# Patient Record
Sex: Female | Born: 1950
Health system: Southern US, Community
[De-identification: ages and names within clinical notes are randomized; demographics above are authoritative.]

## PROBLEM LIST (undated history)

## (undated) HISTORY — PX: BUNIONECTOMY: SHX129

---

## 2004-09-24 ENCOUNTER — Ambulatory Visit: Payer: Self-pay | Admitting: Family Medicine

## 2004-12-17 ENCOUNTER — Ambulatory Visit: Payer: Self-pay | Admitting: Family Medicine

## 2005-01-04 ENCOUNTER — Ambulatory Visit: Payer: Self-pay | Admitting: Family Medicine

## 2005-01-09 ENCOUNTER — Ambulatory Visit (HOSPITAL_BASED_OUTPATIENT_CLINIC_OR_DEPARTMENT_OTHER): Admission: RE | Admit: 2005-01-09 | Discharge: 2005-01-09 | Payer: Self-pay | Admitting: Podiatry

## 2005-01-09 ENCOUNTER — Ambulatory Visit (HOSPITAL_COMMUNITY): Admission: RE | Admit: 2005-01-09 | Discharge: 2005-01-09 | Payer: Self-pay | Admitting: Podiatry

## 2006-06-20 ENCOUNTER — Encounter: Payer: Self-pay | Admitting: Family Medicine

## 2007-01-29 ENCOUNTER — Telehealth (INDEPENDENT_AMBULATORY_CARE_PROVIDER_SITE_OTHER): Payer: Self-pay | Admitting: *Deleted

## 2007-01-29 ENCOUNTER — Encounter: Admission: RE | Admit: 2007-01-29 | Discharge: 2007-01-29 | Payer: Self-pay | Admitting: Family Medicine

## 2007-01-29 ENCOUNTER — Other Ambulatory Visit: Admission: RE | Admit: 2007-01-29 | Discharge: 2007-01-29 | Payer: Self-pay | Admitting: Family Medicine

## 2007-01-29 ENCOUNTER — Ambulatory Visit: Payer: Self-pay | Admitting: Family Medicine

## 2007-01-29 ENCOUNTER — Encounter: Payer: Self-pay | Admitting: Family Medicine

## 2007-01-29 DIAGNOSIS — R5383 Other fatigue: Secondary | ICD-10-CM

## 2007-01-29 DIAGNOSIS — R5381 Other malaise: Secondary | ICD-10-CM | POA: Insufficient documentation

## 2007-01-29 DIAGNOSIS — N951 Menopausal and female climacteric states: Secondary | ICD-10-CM | POA: Insufficient documentation

## 2007-01-30 ENCOUNTER — Encounter: Payer: Self-pay | Admitting: Family Medicine

## 2007-02-03 LAB — CONVERTED CEMR LAB: Pap Smear: NORMAL

## 2007-02-06 ENCOUNTER — Telehealth (INDEPENDENT_AMBULATORY_CARE_PROVIDER_SITE_OTHER): Payer: Self-pay | Admitting: *Deleted

## 2007-02-09 ENCOUNTER — Telehealth: Payer: Self-pay | Admitting: Family Medicine

## 2007-02-10 ENCOUNTER — Ambulatory Visit: Payer: Self-pay | Admitting: Family Medicine

## 2007-02-10 ENCOUNTER — Encounter: Admission: RE | Admit: 2007-02-10 | Discharge: 2007-02-10 | Payer: Self-pay | Admitting: Family Medicine

## 2007-02-10 DIAGNOSIS — N390 Urinary tract infection, site not specified: Secondary | ICD-10-CM | POA: Insufficient documentation

## 2007-02-10 LAB — CONVERTED CEMR LAB
Glucose, Urine, Semiquant: NEGATIVE
Specific Gravity, Urine: 1.015

## 2007-02-13 ENCOUNTER — Telehealth: Payer: Self-pay | Admitting: Family Medicine

## 2007-02-13 ENCOUNTER — Ambulatory Visit: Payer: Self-pay | Admitting: Family Medicine

## 2007-02-13 LAB — CONVERTED CEMR LAB
Bilirubin Urine: NEGATIVE
Glucose, Urine, Semiquant: NEGATIVE
WBC Urine, dipstick: NEGATIVE
pH: 6.5

## 2007-02-14 ENCOUNTER — Encounter: Payer: Self-pay | Admitting: Family Medicine

## 2007-05-25 ENCOUNTER — Telehealth: Payer: Self-pay | Admitting: Family Medicine

## 2007-05-25 DIAGNOSIS — E785 Hyperlipidemia, unspecified: Secondary | ICD-10-CM | POA: Insufficient documentation

## 2007-05-28 ENCOUNTER — Encounter: Payer: Self-pay | Admitting: Family Medicine

## 2007-05-28 LAB — CONVERTED CEMR LAB
Cholesterol: 202 mg/dL — ABNORMAL HIGH (ref 0–200)
VLDL: 13 mg/dL (ref 0–40)

## 2007-05-29 ENCOUNTER — Telehealth: Payer: Self-pay | Admitting: Family Medicine

## 2007-05-29 ENCOUNTER — Encounter: Payer: Self-pay | Admitting: Family Medicine

## 2008-03-08 ENCOUNTER — Telehealth (INDEPENDENT_AMBULATORY_CARE_PROVIDER_SITE_OTHER): Payer: Self-pay | Admitting: *Deleted

## 2008-03-09 ENCOUNTER — Ambulatory Visit: Payer: Self-pay | Admitting: Family Medicine

## 2008-07-01 ENCOUNTER — Encounter: Admission: RE | Admit: 2008-07-01 | Discharge: 2008-07-01 | Payer: Self-pay | Admitting: Orthopedic Surgery

## 2009-01-27 ENCOUNTER — Ambulatory Visit: Payer: Self-pay | Admitting: Family Medicine

## 2009-01-27 DIAGNOSIS — R05 Cough: Secondary | ICD-10-CM

## 2009-01-27 DIAGNOSIS — R059 Cough, unspecified: Secondary | ICD-10-CM | POA: Insufficient documentation

## 2009-01-27 DIAGNOSIS — J209 Acute bronchitis, unspecified: Secondary | ICD-10-CM | POA: Insufficient documentation

## 2010-01-19 ENCOUNTER — Ambulatory Visit
Admission: RE | Admit: 2010-01-19 | Discharge: 2010-01-19 | Payer: Self-pay | Source: Home / Self Care | Admitting: Emergency Medicine

## 2010-01-19 DIAGNOSIS — T300 Burn of unspecified body region, unspecified degree: Secondary | ICD-10-CM | POA: Insufficient documentation

## 2010-01-21 ENCOUNTER — Telehealth (INDEPENDENT_AMBULATORY_CARE_PROVIDER_SITE_OTHER): Payer: Self-pay | Admitting: *Deleted

## 2010-02-11 LAB — CONVERTED CEMR LAB
ALT: 13 units/L (ref 0–35)
AST: 17 units/L (ref 0–37)
Albumin: 4.5 g/dL (ref 3.5–5.2)
BUN: 12 mg/dL (ref 6–23)
Calcium: 9.9 mg/dL (ref 8.4–10.5)
Chloride: 105 meq/L (ref 96–112)
Potassium: 4.4 meq/L (ref 3.5–5.3)
Rhuematoid fact SerPl-aCnc: <20 intl units/mL (ref 0–20)
TSH: 2.559 microintl units/mL (ref 0.350–5.50)
Total Protein: 7.1 g/dL (ref 6.0–8.3)

## 2010-02-13 NOTE — Assessment & Plan Note (Signed)
Summary: Cough-yellow, fever, runny nose-yellow x 1 mth rm 2   Vital Signs:  Patient Profile:   60 Years Old Female CC:      Cold & URI symptoms Height:     68.5 inches Weight:      159 pounds O2 Sat:      100 % O2 treatment:    Room Air Temp:     101.7 degrees F oral Pulse rate:   103 / minute Pulse rhythm:   regular Resp:     16 per minute BP sitting:   150 / 86  (right arm) Cuff size:   regular  Vitals Entered By: Areta Haber CMA (January 27, 2009 5:08 PM)                  Current Allergies: No known allergies History of Present Illness Chief Complaint: Cold & URI symptoms History of Present Illness: Subjective:  Patient complains of developing a URI about a month ago, now with persistent sinus congestion and productive cough worse at night.  One week ago she developed chills, myalgias, and increased fatigue.  No wheezing, pleuritic pain, or shortness of breath.  No GI or GU symptoms   Current Problems: ACUTE BRONCHITIS (ICD-466.0) COUGH (ICD-786.2) HYPERLIPIDEMIA (ICD-272.4) URINARY TRACT INFECTION SITE NOT SPECIFIED (ICD-599.0) ATHEROSCLEROTIC HEART DISEASE, FAMILY HX (ICD-V17.3) ROUTINE GYNECOLOGICAL EXAMINATION (ICD-V72.31) SCREENING FOR ENDOCRINE, NUTRITIONAL, METABOLIC DISORDER (ICD-V77.99) SCREENING FOR LIPOID DISORDERS (ICD-V77.91) FATIGUE (ICD-780.79) POSTMENOPAUSAL STATUS (ICD-627.2) OTHER SCREENING MAMMOGRAM (ICD-V76.12)   Current Meds CEFDINIR 300 MG CAPS (CEFDINIR) 1 by mouth q12hr BENZONATATE 200 MG CAPS (BENZONATATE) One by mouth HS as needed cough  REVIEW OF SYSTEMS Constitutional Symptoms       Complains of fever and chills.     Denies night sweats, weight loss, weight gain, and fatigue.  Eyes       Denies change in vision, eye pain, eye discharge, glasses, contact lenses, and eye surgery. Ear/Nose/Throat/Mouth       Complains of frequent runny nose and sore throat.      Denies hearing loss/aids, change in hearing, ear pain, ear  discharge, dizziness, frequent nose bleeds, sinus problems, hoarseness, and tooth pain or bleeding.      Comments: yellowish x Respiratory       Complains of productive cough.      Denies dry cough, wheezing, shortness of breath, asthma, bronchitis, and emphysema/COPD.  Cardiovascular       Denies murmurs, chest pain, and tires easily with exhertion.    Gastrointestinal       Denies stomach pain, nausea/vomiting, diarrhea, constipation, blood in bowel movements, and indigestion. Genitourniary       Denies painful urination, kidney stones, and loss of urinary control. Neurological       Denies paralysis, seizures, and fainting/blackouts. Musculoskeletal       Denies muscle pain, joint pain, joint stiffness, decreased range of motion, redness, swelling, muscle weakness, and gout.  Skin       Denies bruising, unusual mles/lumps or sores, and hair/skin or nail changes.  Psych       Denies mood changes, temper/anger issues, anxiety/stress, speech problems, depression, and sleep problems. Other Comments: Cough-yellow x . Pt has not been seen by PCP for this.   Past History:  Past Medical History: Last updated: 01/29/2007 also takes fish oil 3 capsules/ day,  foreign travel,  Hallux Rigidus R foot  Past Surgical History: Last updated: 10/22/2005 _  Family History: Last updated: 01/29/2007 1/2 brother- `heart problems`, pacemaker  Father-ETOH, HTN, AMI in 30`s, died at 39 from aneurysm, Mother- Alcoholism, pulm fibrosis, died at 3; goiter sister- thyroid dz  Social History: Last updated: 10/22/2005 Married to [place]; has 26 yo son that lives at home.  Works in [employer].  Goes to [employer], walks and eats very healthy.  Nonsmoker.  Risk Factors: Smoking Status: never (01/29/2007)   Objective:  No acute distress  Eyes:  Pupils are equal, round, and reactive to light and accomdation.  Extraocular movement is intact.  Conjunctivae are not inflamed.  Ears:  Canals normal.   Tympanic membranes normal.   Nose:  Normal septum.  Normal turbinates, mildly congested.  Maxillary sinus tenderness present.  Pharynx:  Normal; moist mucous membranes  Neck:  Supple.  No adenopathy is present.   Lungs:  Clear to auscultation.  Breath sounds are equal.  Heart:  Regular rate and rhythm without murmurs, rubs, or gallops.  Abdomen:  Nontender without masses or hepatosplenomegaly.  Bowel sounds are present.  No CVA or flank tenderness.  Extremities:  No edema.  Chest X-ray negative Assessment New Problems: ACUTE BRONCHITIS (ICD-466.0) COUGH (ICD-786.2)  Suspect bronchitis after a prolonged viral URI,  although onset of myalgias/fever last week could have been the onset of influenza    Plan New Medications/Changes: BENZONATATE 200 MG CAPS (BENZONATATE) One by mouth HS as needed cough  #12 x 0, 01/27/2009, Donna Christen MD CEFDINIR 300 MG CAPS (CEFDINIR) 1 by mouth q12hr  #20 x 0, 01/27/2009, Donna Christen MD  New Orders: T-Chest x-ray, 2 views [[id number]] New Patient Level III [99203] Planning Comments:   Begin Omnicef for 10 days, expectorant/decongestant, cough suppressant at bedtime, increased fluid intake. Follow-up with PCP if not improving one week.    Diagnoses and expected course of recovery discussed and will return if not improved as expected or if the condition worsens. Patient and/or caregiver verbalized understanding.  Prescriptions: BENZONATATE 200 MG CAPS (BENZONATATE) One by mouth HS as needed cough  #12 x 0   Entered and Authorized by:   Donna Christen MD   Signed by:   Donna Christen MD on 01/27/2009   Method used:   Print then Give to Patient   RxID:   [id number] CEFDINIR 300 MG CAPS (CEFDINIR) 1 by mouth q12hr  #20 x 0   Entered and Authorized by:   Donna Christen MD   Signed by:   Donna Christen MD on 01/27/2009   Method used:   Print then Give to Patient   RxID:   [id number]   Patient Instructions: 1)  May use Mucinex D (guaifenesin  with decongestant) twice daily for congestion. 2)  Increase fluid intake, rest. 3)   Also recommend using saline nasal spray several times daily and/or saline nasal irrigation. 4)  Followup with family doctor if not improving one week.   Appended Document: Cough-yellow, fever, runny nose-yellow x 1 mth rm 2 I CALLED Rx IN OVER PHONE TO [facility] THEIR FAX WAS NOT WORKING 7:00 PM 01/27/2009 BY TERESA JONES.
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
## 2010-02-15 NOTE — Progress Notes (Signed)
  Phone Note Outgoing Call Call back at [facility] Phone [phone]   Call placed by: Emilio Math,  January 21, 2010 2:53 PM Call placed to: Patient Summary of Call: Doing a lot better Silvadene helped a lot

## 2010-06-01 NOTE — Op Note (Signed)
Kelli Marquez, Kelli Marquez              ACCOUNT NO.:  [id number]   MEDICAL RECORD NO.:  [id number]            PATIENT TYPE:   LOCATION:                                 FACILITY:   PHYSICIAN:  Ezequiel Kayser. Ajlouny, D.P.M.DATE OF BIRTH:  10-19-1950   DATE OF PROCEDURE:  01/09/2005  DATE OF DISCHARGE:                                 OPERATIVE REPORT   PREOPERATIVE DIAGNOSES:  1.  Hallux rigidus, right first metatarsophalangeal joint.  2.  Painful exostosis, right hallux.  3.  Hammer toe second and fifth right digits.   POSTOPERATIVE DIAGNOSES:  1.  Hallux rigidus, right first metatarsophalangeal joint.  2.  Painful exostosis, right hallux.  3.  Hammer toe second and fifth right digits.   OPERATION PERFORMED:  1.  First metatarsophalangeal joint implant, joint replacement, right foot.  2.  Radical bone resection, right hallux.  3.  Hammer toe repair, second and fifth right toes.   SURGEON:  Ezequiel Kayser. Ajlouny, D.P.M.   ANESTHESIA:  MAC with local.   COMPLICATIONS:  None.   DESCRIPTION OF PROCEDURE:  The patient was brought to the operating room and  placed in the supine position at which time monitored anesthesia care was  administered.  A local block was performed with a 1:1 mixture of 0.5%  Marcaine plain and 1% lidocaine plain.  A well-padded pneumatic ankle  tourniquet was applied superior to the medial malleolus.  The patient was  prepped and draped in the usual aseptic manner.  The foot was exsanguinated  with an Esmarch bandage and the previously applied tourniquet inflated to  250 mmHg.   Attention was directed to the first ray where __________  incision was made.  The incision was deepened via sharp and blunt modality, taking care to clamp  and cauterize all bleeding vessels and ensuring retraction of all  neurovascular structures encountered.  The deep and superficial fascia was  separated medially, dorsally and laterally,  the length of the incision.  Next, the linear  capsulotomy was made and the capsule and periosteum  reflected off the head of the first metatarsal and base of the proximal  phalanx.  Two bone joint mallets were identified and excised in toto.  The  soft tissue structures were freed from the head of the first metatarsal  and  once exposed, the medial eminence was resected with a sagittal saw.  There  was a moderate amount of hypertrophic bone and the first metatarsal head was  remodeled to accommodate the implant. There were moderate erosions at the  first metatarsal head as well.  Next, attention was directed to the base of  the proximal phalanx where the distal third of the proximal phalanx was  resected measuring approximately 7 mm which is enough to accommodate the  implant as well as decompress the joint. Care was taken not to __________  flexor apparatus.  Next, using trial sizers, a size small implant was  identified as the appropriate size and a size small cobalt chromium Biopro  hemi-implant was then implanted to the base of the proximal phalanx after  drilling to  accommodate the stem of the implant.  All bone remodeling was  performed utilizing power and hand rasps.  First metatarsophalangeal joint  range of motion was excellent with no restrictions.  The surgical wound was  irrigated with copious amounts of sterile saline and antibiotic solution.  Deep closure was accomplished using 3-0 Vicryl to the periosteum and  capsule.  Subcutaneous closure was accomplished using 4-0 Vicryl and skin  closure was accomplished.  Next attention was directed to the medial aspect  of the right hallux where linear incision was made over the palpable  prominence.  The incision was deepened using sharp and blunt modalities  taking care to clamp, cauterize all bleeding vessels and ensuring retraction  of all neurovascular structures encountered.  Dissection was carried to the  level of the bone and the periosteum was reflected off the prominent  bone  which was then resected utilizing sagittal saw and all rough edges smoothed  with a power and hand rasp.  Placement of the implant and reduction of the  hallux was identified to be appropriate under the C-arm.  Next, attention  was directed to the second digit where a curvilinear incision was made  extending over the metatarsophalangeal joint and linearly over the proximal  interphalangeal joint.  Incision was deepened via sharp and blunt  modalities, taking to clamp and cauterize all bleeding vessels and ensuring  retraction of all neurovascular structures encountered.  Deep and  superficial fascia were separated medially and laterally the length of the  incision.  Attention was directed to the level of the proximal  interphalangeal joint where soft tissue structures were freed off the head  of the proximal phalanx and the extensor apparatus released.  Once exposed,  the head of the proximal phalanx was resected with sagittal saw and  articular cartilage was resected off the base of the middle phalanx.  The  bone was still found not to be reducible and therefore attention was  directed to the metatarsophalangeal joint where dissection continued and the  extensor tendon was released and the dorsal capsulotomy was performed.  The  bone was still not reducible and therefore, a medial and lateral capsulotomy  was performed and plantar adhesions were freed with a metatarsal elevator.  The deformity was found to be reducible and the wounds irrigated with  sterile saline.  The digit was then stabilized with a 0.045 K-wire and  placement was confirmed with intraoperative radiograph.  The wound was  irrigated again and deep closure was accomplished using 4-0 Vicryl and skin  closure accomplished using 4-0 and 5-0 nylon.  Next, attention was directed  to the fifth digit where incision was made overlying the proximal phalangeal joint.  Incision was deepened via sharp and blunt modality, taking  care to  clamp and cauterize all bleeding vessels and ensuring retraction of all  neurovascular structures encountered.  The deep and superficial fascia were  separated at the level of the proximal phalangeal joint where the extensor  apparatus was released.  All soft tissue structures were freed from the head  of the proximal phalanx.  Bone was exposed.  The head of the proximal  phalanx was resected utilizing the sagittal saw.  The wound was irrigated  with copious amounts of sterile saline and the tendon reapproximated  utilizing 4-0 Vicryl.  Skin closure accomplished using 5-0 nylon.  Postoperatively, dexamethasone phosphate was infiltrated around the surgical  site as well as 0.5% Marcaine plain.  Foot was dressed with Xeroform, 4  x  4s, Kling and Coban.  The tourniquet was deflated and neurovascular status  returned to all digits.  The patient was sent to recovery room with vital  signs stable and capillary refill time at presurgical levels.  Both written  and oral postoperative instructions were given to the patient.  Postoperative shoe dispensed. All questions answered.  No guarantees given.           ______________________________  Ezequiel Kayser. Harriet Pho, D.P.M.     MJA/MEDQ  D:  01/22/2005  T:  01/22/2005  Job:  [id number]

## 2012-02-19 ENCOUNTER — Ambulatory Visit (INDEPENDENT_AMBULATORY_CARE_PROVIDER_SITE_OTHER): Payer: 59 | Admitting: Physician Assistant

## 2012-02-19 ENCOUNTER — Telehealth: Payer: Self-pay | Admitting: *Deleted

## 2012-02-19 ENCOUNTER — Encounter: Payer: Self-pay | Admitting: Physician Assistant

## 2012-02-19 VITALS — BP 130/71 | HR 80 | Wt 143.0 lb

## 2012-02-19 DIAGNOSIS — Z1239 Encounter for other screening for malignant neoplasm of breast: Secondary | ICD-10-CM

## 2012-02-19 DIAGNOSIS — Z780 Asymptomatic menopausal state: Secondary | ICD-10-CM

## 2012-02-19 DIAGNOSIS — Z0000 Encounter for general adult medical examination without abnormal findings: Secondary | ICD-10-CM

## 2012-02-19 DIAGNOSIS — Z23 Encounter for immunization: Secondary | ICD-10-CM

## 2012-02-19 DIAGNOSIS — Z131 Encounter for screening for diabetes mellitus: Secondary | ICD-10-CM

## 2012-02-19 DIAGNOSIS — Z13820 Encounter for screening for osteoporosis: Secondary | ICD-10-CM

## 2012-02-19 DIAGNOSIS — Z13220 Encounter for screening for lipoid disorders: Secondary | ICD-10-CM

## 2012-02-19 DIAGNOSIS — E785 Hyperlipidemia, unspecified: Secondary | ICD-10-CM

## 2012-02-19 LAB — COMPLETE METABOLIC PANEL WITH GFR
AST: 16 U/L (ref 0–37)
Albumin: 4.4 g/dL (ref 3.5–5.2)
BUN: 16 mg/dL (ref 6–23)
Calcium: 9.4 mg/dL (ref 8.4–10.5)
Chloride: 106 mEq/L (ref 96–112)
Creat: 0.6 mg/dL (ref 0.50–1.10)
GFR, Est Non African American: >89 mL/min
Glucose, Bld: 92 mg/dL (ref 70–99)

## 2012-02-19 LAB — TSH: TSH: 2.772 u[IU]/mL (ref 0.350–4.500)

## 2012-02-19 LAB — LIPID PANEL
Cholesterol: 189 mg/dL (ref 0–200)
Total CHOL/HDL Ratio: 3.2 Ratio
Triglycerides: 70 mg/dL (ref <150)
VLDL: 14 mg/dL (ref 0–40)

## 2012-02-19 NOTE — Progress Notes (Signed)
  Subjective:     Kelli Marquez is a 62 y.o. female and is here for a comprehensive physical exam. The patient reports no problems.  Has a strong family history of heart disease, MI's and strokes. Pt aware we need to watch elevated LDL/TG. Currently on no medications.  History   Social History  . Marital Status: Married    Spouse Name: N/A    Number of Children: N/A  . Years of Education: N/A   Occupational History  . Not on file.   Social History Main Topics  . Smoking status: Never Smoker   . Smokeless tobacco: Never Used  . Alcohol Use: Not on file  . Drug Use: No  . Sexually Active: Not on file   Other Topics Concern  . Not on file   Social History Narrative  . No narrative on file   Health Maintenance  Topic Date Due  . Tetanus/tdap  12/28/1969  . Colonoscopy  12/28/2000  . Mammogram  02/09/2009  . Zostavax  12/29/2010  . Pap Smear  02/18/2013  . Influenza Vaccine  09/15/2011    The following portions of the patient's history were reviewed and updated as appropriate: allergies, current medications, past family history, past medical history, past social history, past surgical history and problem list.  Review of Systems A comprehensive review of systems was negative.   Objective:    BP 130/71  Pulse 80  Wt 143 lb (64.864 kg) General appearance: alert, cooperative and appears stated age Head: Normocephalic, without obvious abnormality, atraumatic Eyes: conjunctivae/corneas clear. PERRL, EOM's intact. Fundi benign. Ears: normal TM's and external ear canals both ears Nose: Nares normal. Septum midline. Mucosa normal. No drainage or sinus tenderness. Throat: lips, mucosa, and tongue normal; teeth and gums normal Neck: no adenopathy, no carotid bruit, no JVD, supple, symmetrical, trachea midline and thyroid not enlarged, symmetric, no tenderness/mass/nodules Back: symmetric, no curvature. ROM normal. No CVA tenderness. Lungs: clear to auscultation  bilaterally Breasts: normal appearance, no masses or tenderness Heart: regular rate and rhythm, S1, S2 normal, no murmur, click, rub or gallop Abdomen: soft, non-tender; bowel sounds normal; no masses,  no organomegaly Extremities: extremities normal, atraumatic, no cyanosis or edema Pulses: 2+ and symmetric Skin: Skin color, texture, turgor normal. No rashes or lesions Lymph nodes: Cervical, supraclavicular, and axillary nodes normal. Neurologic: Grossly normal    Assessment:    Healthy female exam.      Plan:    CPE/Hyperlipidemia- Gave pt lab slip to have Lipid drawn along with vitamins and thyroid. Will call pt with results. Declines pap smear today. Never had abnormal pap. Same partner. Will get another in next 5 years. Discussed with patient importance of calcium for bone health. Discussed 4 servings or 1200 mg daily. Encouraged continual exercise. Pt is doing a lot to try to stay off cholesterol meds but aware of strong family history.   Shingles vaccine given. Pt feels like she has gotten Tdap in last 10 years would like to wait.  Will refer for mammogram.  Referred for Bone Density. Declines Colonoscopy pt was given stool cards to return to clinic.   See After Visit Summary for Counseling Recommendations

## 2012-02-19 NOTE — Telephone Encounter (Signed)
Yes see if can add.

## 2012-02-19 NOTE — Patient Instructions (Addendum)
Gave shingles shot today.   Will call with labs.   STay active.

## 2012-02-19 NOTE — Telephone Encounter (Signed)
Pt forgot to ask you to test her thyroid today when you ordered her labs.  Would you like me to call & see if it can be added to what was already drawn? Please advise

## 2012-02-19 NOTE — Telephone Encounter (Signed)
TSH added

## 2012-02-20 LAB — VITAMIN D 25 HYDROXY (VIT D DEFICIENCY, FRACTURES): Vit D, 25-Hydroxy: 35 ng/mL (ref 30–89)

## 2012-02-25 ENCOUNTER — Other Ambulatory Visit: Payer: 59

## 2012-02-25 ENCOUNTER — Telehealth: Payer: Self-pay | Admitting: *Deleted

## 2012-02-25 NOTE — Progress Notes (Signed)
  Subjective:    Patient ID: Kelli Marquez, female    DOB: 05-14-1950, 62 y.o.   MRN: [id number]  HPI    Review of Systems     Objective:   Physical Exam        Assessment & Plan:  Received pt home hemocult cards on 02-25-12.  Hemocult x3 negative. AB

## 2012-02-25 NOTE — Addendum Note (Signed)
Addended by: Donne Anon L on: 02/25/2012 01:30 PM   Modules accepted: Orders

## 2012-11-19 ENCOUNTER — Other Ambulatory Visit: Payer: Self-pay

## 2014-01-11 ENCOUNTER — Encounter: Payer: Self-pay | Admitting: Physician Assistant

## 2014-01-11 ENCOUNTER — Ambulatory Visit (INDEPENDENT_AMBULATORY_CARE_PROVIDER_SITE_OTHER): Payer: 59 | Admitting: Physician Assistant

## 2014-01-11 ENCOUNTER — Other Ambulatory Visit: Payer: Self-pay | Admitting: Physician Assistant

## 2014-01-11 VITALS — BP 156/85 | HR 74 | Ht 68.5 in | Wt 165.0 lb

## 2014-01-11 DIAGNOSIS — R002 Palpitations: Secondary | ICD-10-CM

## 2014-01-11 DIAGNOSIS — R6889 Other general symptoms and signs: Secondary | ICD-10-CM

## 2014-01-11 LAB — CBC WITH DIFFERENTIAL/PLATELET
BASOS ABS: 0.1 10*3/uL (ref 0.0–0.1)
Basophils Relative: 1 % (ref 0–1)
Eosinophils Absolute: 0.2 10*3/uL (ref 0.0–0.7)
Eosinophils Relative: 4 % (ref 0–5)
HCT: 43.2 % (ref 36.0–46.0)
Hemoglobin: 14.1 g/dL (ref 12.0–15.0)
LYMPHS ABS: 1.3 10*3/uL (ref 0.7–4.0)
LYMPHS PCT: 24 % (ref 12–46)
MCH: 29.1 pg (ref 26.0–34.0)
MCHC: 32.6 g/dL (ref 30.0–36.0)
MCV: 89.1 fL (ref 78.0–100.0)
MONO ABS: 0.4 10*3/uL (ref 0.1–1.0)
MONOS PCT: 8 % (ref 3–12)
MPV: 10.5 fL (ref 8.6–12.4)
NEUTROS ABS: 3.5 10*3/uL (ref 1.7–7.7)
Neutrophils Relative %: 63 % (ref 43–77)
PLATELETS: 294 10*3/uL (ref 150–400)
RBC: 4.85 MIL/uL (ref 3.87–5.11)
RDW: 13.4 % (ref 11.5–15.5)
WBC: 5.6 10*3/uL (ref 4.0–10.5)

## 2014-01-11 LAB — COMPLETE METABOLIC PANEL WITH GFR
ALT: 15 U/L (ref 0–35)
AST: 17 U/L (ref 0–37)
Albumin: 4.1 g/dL (ref 3.5–5.2)
Alkaline Phosphatase: 65 U/L (ref 39–117)
BUN: 11 mg/dL (ref 6–23)
CALCIUM: 9.7 mg/dL (ref 8.4–10.5)
CO2: 27 meq/L (ref 19–32)
CREATININE: 0.61 mg/dL (ref 0.50–1.10)
Chloride: 104 mEq/L (ref 96–112)
GFR, Est Non African American: >89 mL/min
GLUCOSE: 103 mg/dL — AB (ref 70–99)
Potassium: 4.4 mEq/L (ref 3.5–5.3)
Sodium: 139 mEq/L (ref 135–145)
Total Bilirubin: 0.4 mg/dL (ref 0.2–1.2)
Total Protein: 6.6 g/dL (ref 6.0–8.3)

## 2014-01-11 LAB — LIPID PANEL
Cholesterol: 222 mg/dL — ABNORMAL HIGH (ref 0–200)
HDL: 55 mg/dL (ref >39)
LDL Cholesterol: 143 mg/dL — ABNORMAL HIGH (ref 0–99)
Total CHOL/HDL Ratio: 4 Ratio
Triglycerides: 119 mg/dL (ref <150)
VLDL: 24 mg/dL (ref 0–40)

## 2014-01-11 NOTE — Patient Instructions (Signed)
Premature Ventricular Contraction Premature ventricular contraction (PVC) is an irregularity of the heart rhythm involving extra or skipped heartbeats. In some cases, they may occur without obvious cause or heart disease. Other times, they can be caused by an electrolyte change in the blood. These need to be corrected. They can also be seen when there is not enough oxygen going to the heart. A common cause of this is plaque or cholesterol buildup. This buildup decreases the blood supply to the heart. In addition, extra beats may be caused or aggravated by:  Excessive smoking.  Alcohol consumption.  Caffeine.  Certain medications  Some street drugs. SYMPTOMS   The sensation of feeling your heart skipping a beat (palpitations).  In many cases, the person may have no symptoms. SIGNS AND TESTS   A physical examination may show an occasional irregularity, but if the PVC beats do not happen often, they may not be found on physical exam.  Blood pressure is usually normal.  Other tests that may find extra beats of the heart are:  An EKG (electrocardiogram)  A Holter monitor which can monitor your heart over longer periods of time  An Angiogram (study of the heart arteries). TREATMENT  Usually extra heartbeats do not need treatment. The condition is treated only if symptoms are severe or if extra beats are very frequent or are causing problems. An underlying cause, if discovered, may also require treatment.  Treatment may also be needed if there may be a risk for other more serious cardiac arrhythmias.  PREVENTION   Moderation in caffeine, alcohol, and tobacco use may reduce the risk of ectopic heartbeats in some people.  Exercise often helps people who lead a sedentary (inactive) lifestyle. PROGNOSIS  PVC heartbeats are generally harmless and do not need treatment.  RISKS AND COMPLICATIONS   Ventricular tachycardia (occasionally).  There usually are no complications.  Other  arrhythmias (occasionally). SEEK IMMEDIATE MEDICAL CARE IF:   You feel palpitations that are frequent or continual.  You develop chest pain or other problems such as shortness of breath, sweating, or nausea and vomiting.  You become light-headed or faint (pass out).  You get worse or do not improve with treatment. Document Released: 08/18/2003 Document Revised: 03/25/2011 Document Reviewed: 02/27/2007 [employer] Patient Information 2015 [employer]. This information is not intended to replace advice given to you by your health care provider. Make sure you discuss any questions you have with your health care provider.  

## 2014-01-11 NOTE — Progress Notes (Signed)
   Subjective:    Patient ID: Kelli Marquez, female    DOB: July 01, 1950, 63 y.o.   MRN: [id number]  HPI  Pt presents to the clinic with palpitations off and on for last 6 months. Family hx of thyroid problems. She is concerned because she has also been more cold than usual. She also has some off and on muscle aches. Palpitations are once a week maybe twice. No known triggers. No SOB, chest pains associated. Not associated with exertion.     Review of Systems  All other systems reviewed and are negative.      Objective:   Physical Exam  Constitutional: She is oriented to person, place, and time. She appears well-developed and well-nourished.  HENT:  Head: Normocephalic and atraumatic.  Cardiovascular: Normal rate, regular rhythm and normal heart sounds.   Pulmonary/Chest: Effort normal and breath sounds normal. She has no wheezes.  Neurological: She is alert and oriented to person, place, and time.  Skin: Skin is dry.  Psychiatric: She has a normal mood and affect. Her behavior is normal.          Assessment & Plan:  Heart palpitations/cold intolerance-  EKG- NSR at 71, no ST elevation or depression, no arrhymias, slighlty peaked t waves v2-v4 will correlate with labs  Symptoms sound consisent with PVC's. HO given. Discussed triggers.  Will check for anemia, thyroid issues, electrolyte issues.  If not improving will consider heart monitor. Pt aware medication to treat as well if would like to see if decreased episodes.  Discussed if had an episode that did not pass to come if for EKG torule out a.fib.    Hyperlipidemia- fasting and not checked in over a years. Pt given lab slip.

## 2014-01-12 LAB — FERRITIN: FERRITIN: 38 ng/mL (ref 10–291)

## 2014-01-12 LAB — VITAMIN D 25 HYDROXY (VIT D DEFICIENCY, FRACTURES): Vit D, 25-Hydroxy: 33 ng/mL (ref 30–100)

## 2014-01-12 LAB — TSH: TSH: 2.897 u[IU]/mL (ref 0.350–4.500)

## 2014-01-12 LAB — VITAMIN B12: Vitamin B-12: 593 pg/mL (ref 211–911)

## 2014-01-12 LAB — T4, FREE: FREE T4: 1.2 ng/dL (ref 0.80–1.80)

## 2014-01-13 LAB — HEMOGLOBIN A1C
Hgb A1c MFr Bld: 6.3 % — ABNORMAL HIGH (ref <5.7)
Mean Plasma Glucose: 134 mg/dL — ABNORMAL HIGH (ref <117)

## 2014-01-16 ENCOUNTER — Encounter: Payer: Self-pay | Admitting: Physician Assistant

## 2014-01-16 DIAGNOSIS — R7303 Prediabetes: Secondary | ICD-10-CM | POA: Insufficient documentation

## 2014-01-17 ENCOUNTER — Encounter: Payer: Self-pay | Admitting: Physician Assistant

## 2014-01-18 NOTE — Addendum Note (Signed)
Addended by: Chalmers CaterUTTLE, Amberrose Friebel H on: 01/18/2014 08:39 AM   Modules accepted: Orders

## 2014-02-16 ENCOUNTER — Encounter: Payer: Self-pay | Admitting: Physician Assistant

## 2014-02-18 ENCOUNTER — Telehealth: Payer: Self-pay

## 2014-02-18 NOTE — Addendum Note (Signed)
Addended by: Chalmers CaterUTTLE, Gardenia Witter H on: 02/18/2014 09:02 AM   Modules accepted: Orders

## 2014-02-18 NOTE — Telephone Encounter (Signed)
Great!

## 2014-02-18 NOTE — Telephone Encounter (Signed)
Kelli Marquez, Patient sent a message through MyChart. After reviewing the message and the last office note I ordered an event monitor.

## 2014-02-28 ENCOUNTER — Ambulatory Visit (INDEPENDENT_AMBULATORY_CARE_PROVIDER_SITE_OTHER): Payer: 59 | Admitting: Physician Assistant

## 2014-02-28 ENCOUNTER — Encounter: Payer: Self-pay | Admitting: Physician Assistant

## 2014-02-28 VITALS — BP 124/71 | HR 96 | Ht 68.5 in | Wt 150.0 lb

## 2014-02-28 DIAGNOSIS — R002 Palpitations: Secondary | ICD-10-CM

## 2014-02-28 DIAGNOSIS — E785 Hyperlipidemia, unspecified: Secondary | ICD-10-CM

## 2014-02-28 DIAGNOSIS — I493 Ventricular premature depolarization: Secondary | ICD-10-CM

## 2014-02-28 NOTE — Progress Notes (Signed)
   Subjective:    Patient ID: Kelli Marquez, female    DOB: [birth year], 64 y.o.   MRN: [id number]  HPI Pt presents to the clinic to follow up on heart palpitations. Was seen in December with normal EKG. We had scheduled a cardiac heart monitor but never got released in epic and was never scheduled. She doesn't feel like worse but the same. Still concerned because of all the heart issues in her family. Denies any CP, tightness, headaches, jaw pain or vision changes. LDL was elevated but under 160 and pt declined medications at this time working on diet and exercise. Labs to check thyroid, anemia, cmp were all normal.    Review of Systems  All other systems reviewed and are negative.      Objective:   Physical Exam  Constitutional: She is oriented to person, place, and time. She appears well-developed and well-nourished.  HENT:  Head: Normocephalic and atraumatic.  Cardiovascular: Normal rate and normal heart sounds.   I was able to auscultate some PVC's today.   Pulmonary/Chest: Effort normal and breath sounds normal.  Neurological: She is alert and oriented to person, place, and time.  Psychiatric: She has a normal mood and affect. Her behavior is normal.          Assessment & Plan:  Heart palpitations- release cardiac monitor to be ordered. Gave HO on PVC's. Pt does not drink alcohol or caffiene or smoke.  Discussed how beta blockers could decrease palpitations. She is not willing to start at this time due to potential side effects.   Hyperlipidemia- still working on diet and exercise. Will check in 6 months. Concerned with family hx and risk of blockages and PVC's. Will order stress test as well.

## 2014-02-28 NOTE — Patient Instructions (Addendum)
Atrial Fibrillation Atrial fibrillation is a type of irregular heart rhythm (arrhythmia). During atrial fibrillation, the upper chambers of the heart (atria) quiver continuously in a chaotic pattern. This causes an irregular and often rapid heart rate.  Atrial fibrillation is the result of the heart becoming overloaded with disorganized signals that tell it to beat. These signals are normally released one at a time by a part of the right atrium called the sinoatrial node. They then travel from the atria to the lower chambers of the heart (ventricles), causing the atria and ventricles to contract and pump blood as they pass. In atrial fibrillation, parts of the atria outside of the sinoatrial node also release these signals. This results in two problems. First, the atria receive so many signals that they do not have time to fully contract. Second, the ventricles, which can only receive one signal at a time, beat irregularly and out of rhythm with the atria.  There are three types of atrial fibrillation:   Paroxysmal. Paroxysmal atrial fibrillation starts suddenly and stops on its own within a week.  Persistent. Persistent atrial fibrillation lasts for more than a week. It may stop on its own or with treatment.  Permanent. Permanent atrial fibrillation does not go away. Episodes of atrial fibrillation may lead to permanent atrial fibrillation. Atrial fibrillation can prevent your heart from pumping blood normally. It increases your risk of stroke and can lead to heart failure.  CAUSES   Heart conditions, including a heart attack, heart failure, coronary artery disease, and heart valve conditions.   Inflammation of the sac that surrounds the heart (pericarditis).  Blockage of an artery in the lungs (pulmonary embolism).  Pneumonia or other infections.  Chronic lung disease.  Thyroid problems, especially if the thyroid is overactive (hyperthyroidism).  Caffeine, excessive alcohol use, and use  of some illegal drugs.   Use of some medicines, including certain decongestants and diet pills.  Heart surgery.   Birth defects.  Sometimes, no cause can be found. When this happens, the atrial fibrillation is called lone atrial fibrillation. The risk of complications from atrial fibrillation increases if you have lone atrial fibrillation and you are age 23 years or older. RISK FACTORS  Heart failure.  Coronary artery disease.  Diabetes mellitus.   High blood pressure (hypertension).   Obesity.   Other arrhythmias.   Increased age. SIGNS AND SYMPTOMS   A feeling that your heart is beating rapidly or irregularly.   A feeling of discomfort or pain in your chest.   Shortness of breath.   Sudden light-headedness or weakness.   Getting tired easily when exercising.   Urinating more often than normal (mainly when atrial fibrillation first begins).  In paroxysmal atrial fibrillation, symptoms may start and suddenly stop. DIAGNOSIS  Your health care provider may be able to detect atrial fibrillation when taking your pulse. Your health care provider may have you take a test called an ambulatory electrocardiogram (ECG). An ECG records your heartbeat patterns over a 24-hour period. You may also have other tests, such as:  Transthoracic echocardiogram (TTE). During echocardiography, sound waves are used to evaluate how blood flows through your heart.  Transesophageal echocardiogram (TEE).  Stress test. There is more than one type of stress test. If a stress test is needed, ask your health care provider about which type is best for you.  Chest X-ray exam.  Blood tests.  Computed tomography (CT). TREATMENT  Treatment may include:  Treating any underlying conditions. For example, if you  have an overactive thyroid, treating the condition may correct atrial fibrillation.  Taking medicine. Medicines may be given to control a rapid heart rate or to prevent blood  clots, heart failure, or a stroke.  Having a procedure to correct the rhythm of the heart:  Electrical cardioversion. During electrical cardioversion, a controlled, low-energy shock is delivered to the heart through your skin. If you have chest pain, very low blood pressure, or sudden heart failure, this procedure may need to be done as an emergency.  Catheter ablation. During this procedure, heart tissues that send the signals that cause atrial fibrillation are destroyed.  Surgical ablation. During this surgery, thin lines of heart tissue that carry the abnormal signals are destroyed. This procedure can either be an open-heart surgery or a minimally invasive surgery. With the minimally invasive surgery, small cuts are made to access the heart instead of a large opening.  Pulmonary venous isolation. During this surgery, tissue around the veins that carry blood from the lungs (pulmonary veins) is destroyed. This tissue is thought to carry the abnormal signals. HOME CARE INSTRUCTIONS   Take medicines only as directed by your health care provider. Some medicines can make atrial fibrillation worse or recur.  If blood thinners were prescribed by your health care provider, take them exactly as directed. Too much blood-thinning medicine can cause bleeding. If you take too little, you will not have the needed protection against stroke and other problems.  Perform blood tests at home if directed by your health care provider. Perform blood tests exactly as directed.  Quit smoking if you smoke.  Do not drink alcohol.  Do not drink caffeinated beverages such as coffee, soda, and some teas. You may drink decaffeinated coffee, soda, or tea.   Maintain a healthy weight.Do not use diet pills unless your health care provider approves. They may make heart problems worse.   Follow diet instructions as directed by your health care provider.  Exercise regularly as directed by your health care  provider.  Keep all follow-up visits as directed by your health care provider. This is important. PREVENTION  The following substances can cause atrial fibrillation to recur:   Caffeinated beverages.  Alcohol.  Certain medicines, especially those used for breathing problems.  Certain herbs and herbal medicines, such as those containing ephedra or ginseng.  Illegal drugs, such as cocaine and amphetamines. Sometimes medicines are given to prevent atrial fibrillation from recurring. Proper treatment of any underlying condition is also important in helping prevent recurrence.  SEEK MEDICAL CARE IF:  You notice a change in the rate, rhythm, or strength of your heartbeat.  You suddenly begin urinating more frequently.  You tire more easily when exerting yourself or exercising. SEEK IMMEDIATE MEDICAL CARE IF:   You have chest pain, abdominal pain, sweating, or weakness.  You feel nauseous.  You have shortness of breath.  You suddenly have swollen feet and ankles.  You feel dizzy.  Your face or limbs feel numb or weak.  You have a change in your vision or speech. MAKE SURE YOU:   Understand these instructions.  Will watch your condition.  Will get help right away if you are not doing well or get worse. Document Released: 12/31/2004 Document Revised: 05/17/2013 Document Reviewed: 02/11/2012 [facility] Patient Information 2015 [place], [place]. This information is not intended to replace advice given to you by your health care provider. Make sure you discuss any questions you have with your health care provider.   Premature Ventricular Contraction Premature  ventricular contraction (PVC) is an irregularity of the heart rhythm involving extra or skipped heartbeats. In some cases, they may occur without obvious cause or heart disease. Other times, they can be caused by an electrolyte change in the blood. These need to be corrected. They can also be seen when there is not enough  oxygen going to the heart. A common cause of this is plaque or cholesterol buildup. This buildup decreases the blood supply to the heart. In addition, extra beats may be caused or aggravated by:  Excessive smoking.  Alcohol consumption.  Caffeine.  Certain medications  Some street drugs. SYMPTOMS   The sensation of feeling your heart skipping a beat (palpitations).  In many cases, the person may have no symptoms. SIGNS AND TESTS   A physical examination may show an occasional irregularity, but if the PVC beats do not happen often, they may not be found on physical exam.  Blood pressure is usually normal.  Other tests that may find extra beats of the heart are:  An EKG (electrocardiogram)  A Holter monitor which can monitor your heart over longer periods of time  An Angiogram (study of the heart arteries). TREATMENT  Usually extra heartbeats do not need treatment. The condition is treated only if symptoms are severe or if extra beats are very frequent or are causing problems. An underlying cause, if discovered, may also require treatment.  Treatment may also be needed if there may be a risk for other more serious cardiac arrhythmias.  PREVENTION   Moderation in caffeine, alcohol, and tobacco use may reduce the risk of ectopic heartbeats in some people.  Exercise often helps people who lead a sedentary (inactive) lifestyle. PROGNOSIS  PVC heartbeats are generally harmless and do not need treatment.  RISKS AND COMPLICATIONS   Ventricular tachycardia (occasionally).  There usually are no complications.  Other arrhythmias (occasionally). SEEK IMMEDIATE MEDICAL CARE IF:   You feel palpitations that are frequent or continual.  You develop chest pain or other problems such as shortness of breath, sweating, or nausea and vomiting.  You become light-headed or faint (pass out).  You get worse or do not improve with treatment. Document Released: 08/18/2003 Document Revised:  03/25/2011 Document Reviewed: 02/27/2007 [facility] Patient Information 2015 [place]. This information is not intended to replace advice given to you by your health care provider. Make sure you discuss any questions you have with your health care provider.

## 2014-03-01 DIAGNOSIS — E785 Hyperlipidemia, unspecified: Secondary | ICD-10-CM | POA: Insufficient documentation

## 2014-03-01 DIAGNOSIS — I493 Ventricular premature depolarization: Secondary | ICD-10-CM | POA: Insufficient documentation

## 2014-06-09 ENCOUNTER — Ambulatory Visit: Payer: 59 | Admitting: Family Medicine

## 2015-05-31 ENCOUNTER — Telehealth: Payer: Self-pay | Admitting: Physician Assistant

## 2015-05-31 ENCOUNTER — Ambulatory Visit (INDEPENDENT_AMBULATORY_CARE_PROVIDER_SITE_OTHER): Payer: 59 | Admitting: Physician Assistant

## 2015-05-31 ENCOUNTER — Encounter: Payer: Self-pay | Admitting: Physician Assistant

## 2015-05-31 VITALS — BP 128/73 | HR 80 | Ht 68.5 in | Wt 143.0 lb

## 2015-05-31 DIAGNOSIS — Z8249 Family history of ischemic heart disease and other diseases of the circulatory system: Secondary | ICD-10-CM | POA: Diagnosis not present

## 2015-05-31 DIAGNOSIS — R1013 Epigastric pain: Secondary | ICD-10-CM | POA: Diagnosis not present

## 2015-05-31 MED ORDER — OMEPRAZOLE 40 MG PO CPDR: 40.0000 mg | DELAYED_RELEASE_CAPSULE | Freq: Every day | ORAL | Status: DC

## 2015-05-31 NOTE — Progress Notes (Signed)
   Subjective:    Patient ID: Kelli Marquez, female    DOB: 07/30/1950, 65 y.o.   MRN: [id number]  HPI Patient is a 65 year old female who presents to the clinic with some epigastric pressure for the last few months. She denies pain or acid reflux symptoms. She denies any indigestion. She denies any changes in her stools with color or consistency. She describes the symptoms as a pressure in the upper abdomen that comes and goes and often feels "bubbly". She is concerned because her father died from a abdominal aortic aneurysm. She has never had a history of hypertension. Her cholesterol was controlled at last check. She did have a abdominal ultrasound in 2009 that was negative for any aneurysm. She is not aware of anything that causes these symptoms.   Review of Systems  All other systems reviewed and are negative.      Objective:   Physical Exam  Constitutional: She is oriented to person, place, and time. She appears well-developed and well-nourished.  HENT:  Head: Normocephalic and atraumatic.  Cardiovascular: Normal rate, regular rhythm and normal heart sounds.   Pulmonary/Chest: Effort normal and breath sounds normal.  Abdominal: Soft. Bowel sounds are normal. She exhibits no distension and no mass. There is no tenderness. There is no rebound and no guarding.  Neurological: She is alert and oriented to person, place, and time.  Skin: Skin is dry.  Psychiatric: She has a normal mood and affect. Her behavior is normal.          Assessment & Plan:  Epigastric pressure-I feel like symptoms are most consistent with hiatal hernia. I did give patient a handout on hiatal hernia. I offered her a proton pump inhibitor to take for the next 2 months. I'm aware she is not having acid reflux symptoms but this could also help with the symptoms she is having. Since she does have a family history of abdominal aortic aneurysm I think it is reasonable to order an ultrasound of the abdomen to look  for this. She would like to hold off on this into her complete physical in the summer. Encouraged her to follow-up sooner if the symptoms change.  Patient needs a complete physical to discuss needed health maintenance items. We had ordered the colon guard in the past and she reports sending a sample but never getting the results. I do not see the results in the chart. I will have amber call the company and see if the results are on file there.

## 2015-05-31 NOTE — Patient Instructions (Signed)
Hiatal Hernia  A hiatal hernia occurs when part of your stomach slides above the muscle that separates your abdomen from your chest (diaphragm). You can be born with a hiatal hernia (congenital), or it may develop over time. In almost all cases of hiatal hernia, only the top part of the stomach pushes through.   Many people have a hiatal hernia with no symptoms. The larger the hernia, the more likely that you will have symptoms. In some cases, a hiatal hernia allows stomach acid to flow back into the tube that carries food from your mouth to your stomach (esophagus). This may cause heartburn symptoms. Severe heartburn symptoms may mean you have developed a condition called gastroesophageal reflux disease (GERD).   CAUSES   Hiatal hernias are caused by a weakness in the opening (hiatus) where your esophagus passes through your diaphragm to attach to the upper part of your stomach. You may be born with a weakness in your hiatus, or a weakness can develop.  RISK FACTORS  Older age is a major risk factor for a hiatal hernia. Anything that increases pressure on your diaphragm can also increase your risk of a hiatal hernia. This includes:  · Pregnancy.  · Excess weight.  · Frequent constipation.  SIGNS AND SYMPTOMS   People with a hiatal hernia often have no symptoms. If symptoms develop, they are almost always caused by GERD. They may include:  · Heartburn.  · Belching.  · Indigestion.  · Trouble swallowing.  · Coughing or wheezing.   · Sore throat.  · Hoarseness.  · Chest pain.  DIAGNOSIS   A hiatal hernia is sometimes found during an exam for another problem. Your health care provider may suspect a hiatal hernia if you have symptoms of GERD. Tests may be done to diagnose GERD. These may include:  · X-rays of your stomach or chest.  · An upper gastrointestinal (GI) series. This is an X-ray exam of your GI tract involving the use of a chalky liquid that you swallow. The liquid shows up clearly on the X-ray.  · Endoscopy.  This is a procedure to look into your stomach using a thin, flexible tube that has a tiny camera and light on the end of it.  TREATMENT   If you have no symptoms, you may not need treatment. If you have symptoms, treatment may include:  · Dietary and lifestyle changes to help reduce GERD symptoms.  · Medicines. These may include:    Over-the-counter antacids.    Medicines that make your stomach empty more quickly.    Medicines that block the production of stomach acid (H2 blockers).    Stronger medicines to reduce stomach acid (proton pump inhibitors).  · You may need surgery to repair the hernia if other treatments are not helping.  HOME CARE INSTRUCTIONS   · Take all medicines as directed by your health care provider.  · Quit smoking, if you smoke.  · Try to achieve and maintain a healthy body weight.  · Eat frequent small meals instead of three large meals a day. This keeps your stomach from getting too full.    Eat slowly.    Do not lie down right after eating.     Do not eat 1-2 hours before bed.    · Do not drink beverages with caffeine. These include cola, coffee, cocoa, and tea.  · Do not drink alcohol.  · Avoid foods that can make symptoms of GERD worse. These may include:    Fatty foods.      Citrus fruits.    Other foods and drinks that contain acid.  · Avoid putting pressure on your belly. Anything that puts pressure on your belly increases the amount of acid that may be pushed up into your esophagus.      Avoid bending over, especially after eating.    Raise the head of your bed by putting blocks under the legs. This keeps your head and esophagus higher than your stomach.    Do not wear tight clothing around your chest or stomach.    Try not to strain when having a bowel movement, when urinating, or when lifting heavy objects.  SEEK MEDICAL CARE IF:  · Your symptoms are not controlled with medicines or lifestyle changes.  · You are having trouble swallowing.  · You have coughing or wheezing that will not  go away.  SEEK IMMEDIATE MEDICAL CARE IF:  · Your pain is getting worse.  · Your pain spreads to your arms, neck, jaw, teeth, or back.  · You have shortness of breath.  · You sweat for no reason.  · You feel sick to your stomach (nauseous) or vomit.  · You vomit blood.  · You have bright red blood in your stools.  · You have black, tarry stools.       This information is not intended to replace advice given to you by your health care provider. Make sure you discuss any questions you have with your health care provider.     Document Released: 03/23/2003 Document Revised: 01/21/2014 Document Reviewed: 12/18/2012  Elsevier Interactive Patient Education ©2016 [employer]

## 2015-05-31 NOTE — Telephone Encounter (Signed)
Please call cologu[employer] company. She states she returned kit but has not been notified of results. I do not see any scanned information with results.

## 2015-06-09 NOTE — Telephone Encounter (Signed)
Please alert patient and ask if she has been billed or anything? If not can we get her to sign another form to get done.

## 2015-06-09 NOTE — Telephone Encounter (Signed)
Spoke with Tasia Catchingsraig with [employer] and there is no record at all for this pt.

## 2015-07-12 NOTE — Telephone Encounter (Signed)
Left a message advising patient of recommendations.  

## 2015-08-09 ENCOUNTER — Encounter: Payer: Self-pay | Admitting: Family Medicine

## 2015-08-09 ENCOUNTER — Ambulatory Visit (INDEPENDENT_AMBULATORY_CARE_PROVIDER_SITE_OTHER): Payer: 59 | Admitting: Family Medicine

## 2015-08-09 VITALS — BP 143/85 | HR 81 | Temp 97.7°F | Wt 147.0 lb

## 2015-08-09 DIAGNOSIS — L309 Dermatitis, unspecified: Secondary | ICD-10-CM

## 2015-08-09 MED ORDER — TRIAMCINOLONE ACETONIDE 0.5 % EX CREA: 1.0000 "application " | TOPICAL_CREAM | Freq: Two times a day (BID) | CUTANEOUS | 3 refills | Status: DC

## 2015-08-09 NOTE — Patient Instructions (Signed)
Thank you for coming in today. Apply the cream twice daily as needed.  Return as needed.    Contact Dermatitis Dermatitis is redness, soreness, and swelling (inflammation) of the skin. Contact dermatitis is a reaction to certain substances that touch the skin. There are two types of contact dermatitis:   Irritant contact dermatitis. This type is caused by something that irritates your skin, such as dry hands from washing them too much. This type does not require previous exposure to the substance for a reaction to occur. This type is more common.  Allergic contact dermatitis. This type is caused by a substance that you are allergic to, such as a nickel allergy or poison ivy. This type only occurs if you have been exposed to the substance (allergen) before. Upon a repeat exposure, your body reacts to the substance. This type is less common. CAUSES  Many different substances can cause contact dermatitis. Irritant contact dermatitis is most commonly caused by exposure to:   Makeup.   Soaps.   Detergents.   Bleaches.   Acids.   Metal salts, such as nickel.  Allergic contact dermatitis is most commonly caused by exposure to:   Poisonous plants.   Chemicals.   Jewelry.   Latex.   Medicines.   Preservatives in products, such as clothing.  RISK FACTORS This condition is more likely to develop in:   People who have jobs that expose them to irritants or allergens.  People who have certain medical conditions, such as asthma or eczema.  SYMPTOMS  Symptoms of this condition may occur anywhere on your body where the irritant has touched you or is touched by you. Symptoms include:  Dryness or flaking.   Redness.   Cracks.   Itching.   Pain or a burning feeling.   Blisters.  Drainage of small amounts of blood or clear fluid from skin cracks. With allergic contact dermatitis, there may also be swelling in areas such as the eyelids, mouth, or genitals.    DIAGNOSIS  This condition is diagnosed with a medical history and physical exam. A patch skin test may be performed to help determine the cause. If the condition is related to your job, you may need to see an occupational medicine specialist. TREATMENT Treatment for this condition includes figuring out what caused the reaction and protecting your skin from further contact. Treatment may also include:   Steroid creams or ointments. Oral steroid medicines may be needed in more severe cases.  Antibiotics or antibacterial ointments, if a skin infection is present.  Antihistamine lotion or an antihistamine taken by mouth to ease itching.  A bandage (dressing). HOME CARE INSTRUCTIONS Skin Care  Moisturize your skin as needed.   Apply cool compresses to the affected areas.  Try taking a bath with:  Epsom salts. Follow the instructions on the packaging. You can get these at your local pharmacy or grocery store.  Baking soda. Pour a small amount into the bath as directed by your health care provider.  Colloidal oatmeal. Follow the instructions on the packaging. You can get this at your local pharmacy or grocery store.  Try applying baking soda paste to your skin. Stir water into baking soda until it reaches a paste-like consistency.  Do not scratch your skin.  Bathe less frequently, such as every other day.  Bathe in lukewarm water. Avoid using hot water. Medicines  Take or apply over-the-counter and prescription medicines only as told by your health care provider.   If you were  prescribed an antibiotic medicine, take or apply your antibiotic as told by your health care provider. Do not stop using the antibiotic even if your condition starts to improve. General Instructions  Keep all follow-up visits as told by your health care provider. This is important.  Avoid the substance that caused your reaction. If you do not know what caused it, keep a journal to try to track what  caused it. Write down:  What you eat.  What cosmetic products you use.  What you drink.  What you wear in the affected area. This includes jewelry.  If you were given a dressing, take care of it as told by your health care provider. This includes when to change and remove it. SEEK MEDICAL CARE IF:   Your condition does not improve with treatment.  Your condition gets worse.  You have signs of infection such as swelling, tenderness, redness, soreness, or warmth in the affected area.  You have a fever.  You have new symptoms. SEEK IMMEDIATE MEDICAL CARE IF:   You have a severe headache, neck pain, or neck stiffness.  You vomit.  You feel very sleepy.  You notice red streaks coming from the affected area.  Your bone or joint underneath the affected area becomes painful after the skin has healed.  The affected area turns darker.  You have difficulty breathing.   This information is not intended to replace advice given to you by your health care provider. Make sure you discuss any questions you have with your health care provider.   Document Released: 12/29/1999 Document Revised: 09/21/2014 Document Reviewed: 05/18/2014 Elsevier Interactive Patient Education [employer]

## 2015-08-09 NOTE — Progress Notes (Signed)
       Kelli Marquez is a 65 y.o. female who presents to [facility]: Primary Care Sports Medicine today for rash. Patient notes a faint mildly erythematous mildly pruritic rash on her left leg and left upper arm. It's been present for a few days. She cannot recall any new contacts or exposures. She denies any new medications cosmetics detergent soap shampoos. She feels well with no fevers or chills. She notes the rash is non-painful. She notes that she has had a shingles vaccine.   No past medical history on file. Past Surgical History:  Procedure Laterality Date  . BUNIONECTOMY     right foot.   Social History  Substance Use Topics  . Smoking status: Never Smoker  . Smokeless tobacco: Never Used  . Alcohol use Not on file   family history includes Aortic aneurysm in her father; Heart attack in her father, maternal grandmother, maternal uncle, and paternal grandfather; Heart disease in her father and mother; Hyperlipidemia in her father.  ROS as above:  Medications: Current Outpatient Prescriptions  Medication Sig Dispense Refill  . triamcinolone cream (KENALOG) 0.5 % Apply 1 application topically 2 (two) times daily. To affected areas. 30 g 3   No current facility-administered medications for this visit.    No Known Allergies   Exam:  BP (!) 143/85   Pulse 81   Temp 97.7 F (36.5 C) (Oral)   Wt 147 lb (66.7 kg)   SpO2 99%   BMI 22.03 kg/m  Gen: Well NAD HEENT: EOMI,  MMM Lungs: Normal work of breathing. CTABL Heart: RRR no MRG Abd: NABS, Soft. Nondistended, Nontender Exts: Brisk capillary refill, warm and well perfused.  Skin: Mildly erythematous macular circular blotchy rash on left upper arm and left leg. No vesicles. Blanchable. Nontender.  No results found for this or any previous visit (from the past 24 hour(s)). No results found.    Assessment and Plan: 65 y.o. female  with rash. Probable contact dermatitis.  Doubtful for tinea corporis or shingles. Plan for trial of triamcinolone cream and follow-up as needed.  New problem unclear diagnosis. Moderate complexity.  Discussed warning signs or symptoms. Please see discharge instructions. Patient expresses understanding.

## 2016-05-01 ENCOUNTER — Emergency Department (INDEPENDENT_AMBULATORY_CARE_PROVIDER_SITE_OTHER): Payer: 59

## 2016-05-01 ENCOUNTER — Encounter: Payer: Self-pay | Admitting: Emergency Medicine

## 2016-05-01 ENCOUNTER — Emergency Department
Admission: EM | Admit: 2016-05-01 | Discharge: 2016-05-01 | Disposition: A | Discharge status: Home / Self Care | Payer: 59 | Source: Home / Self Care | Attending: Family Medicine | Admitting: Family Medicine

## 2016-05-01 DIAGNOSIS — R52 Pain, unspecified: Secondary | ICD-10-CM

## 2016-05-01 DIAGNOSIS — M25541 Pain in joints of right hand: Secondary | ICD-10-CM | POA: Diagnosis not present

## 2016-05-01 DIAGNOSIS — S63636A Sprain of interphalangeal joint of right little finger, initial encounter: Secondary | ICD-10-CM

## 2016-05-01 NOTE — Discharge Instructions (Signed)
Begin finger exercises.

## 2016-05-01 NOTE — ED Triage Notes (Signed)
Pt c/o right pinky pain following a fall she had about 3 weeks ago. States it is still swollen and painful at times.

## 2016-05-01 NOTE — ED Provider Notes (Signed)
[facility]    CSN: [id number] Arrival date & time: 05/01/16  0817     History   Chief Complaint Chief Complaint  Patient presents with  . Finger Injury    HPI Kelli Marquez is a 66 y.o. female.   Patient was chasing her dog about 3 weeks ago when she fell and "jammed" her right fifth finger.  She had pain and swelling at the PIP joint initially, now improved.  She states that she presently has only mild soreness of her finger.   The history is provided by the patient.  Hand Pain  This is a new problem. Episode onset: 3 weeks ago. The problem occurs daily. The problem has been rapidly improving. Exacerbated by: flexing finger. The symptoms are relieved by rest. She has tried nothing for the symptoms.    History reviewed. No pertinent past medical history.  Patient Active Problem List   Diagnosis Date Noted  . Family history of abdominal aortic aneurysm 05/31/2015  . Epigastric pressure 05/31/2015  . PVC's (premature ventricular contractions) 03/01/2014  . Hyperlipemia 03/01/2014  . Pre-diabetes 01/16/2014  . Heart palpitations 01/11/2014  . Cold intolerance 01/11/2014  . ERYTHEMA DUE TO BURN OF MULTIPLE SPECIFIED SITES 01/19/2010  . ACUTE BRONCHITIS 01/27/2009  . COUGH 01/27/2009  . HYPERLIPIDEMIA 05/25/2007  . URINARY TRACT INFECTION SITE NOT SPECIFIED 02/10/2007  . POSTMENOPAUSAL STATUS 01/29/2007  . FATIGUE 01/29/2007    Past Surgical History:  Procedure Laterality Date  . BUNIONECTOMY     right foot.    OB History    No data available       Home Medications    Prior to Admission medications   Medication Sig Start Date End Date Taking? Authorizing Provider  triamcinolone cream (KENALOG) 0.5 % Apply 1 application topically 2 (two) times daily. To affected areas. 08/09/15   Rodolph Bong, MD    Family History Family History  Problem Relation Age of Onset  . Heart disease Mother   . Heart disease Father   . Heart attack Father   .  Aortic aneurysm Father   . Hyperlipidemia Father   . Heart attack Maternal Uncle   . Heart attack Maternal Grandmother   . Heart attack Paternal Grandfather     Social History Social History  Substance Use Topics  . Smoking status: Never Smoker  . Smokeless tobacco: Never Used  . Alcohol use No     Allergies   Patient has no known allergies.   Review of Systems Review of Systems  All other systems reviewed and are negative.    Physical Exam Triage Vital Signs ED Triage Vitals  Enc Vitals Group     BP 05/01/16 0918 132/82     Pulse Rate 05/01/16 0918 84     Resp --      Temp 05/01/16 0918 98 F (36.7 C)     Temp Source 05/01/16 0918 Oral     SpO2 05/01/16 0918 98 %     Weight 05/01/16 0919 160 lb (72.6 kg)     Height --      Head Circumference --      Peak Flow --      Pain Score --      Pain Loc --      Pain Edu? --      Excl. in GC? --    No data found.   Updated Vital Signs BP 132/82 (BP Location: Right Arm)   Pulse 84  Temp 98 F (36.7 C) (Oral)   Wt 160 lb (72.6 kg)   SpO2 98%   BMI 23.97 kg/m   Visual Acuity Right Eye Distance:   Left Eye Distance:   Bilateral Distance:    Right Eye Near:   Left Eye Near:    Bilateral Near:     Physical Exam  Constitutional: She appears well-developed and well-nourished. No distress.  HENT:  Head: Atraumatic.  Eyes: Pupils are equal, round, and reactive to light.  Cardiovascular: Normal rate.   Pulmonary/Chest: Effort normal.  Musculoskeletal:       Right hand: She exhibits tenderness and bony tenderness. She exhibits normal range of motion, normal two-point discrimination, normal capillary refill, no deformity, no laceration and no swelling.       Hands: Right fifth finger has full range of motion, both flexion and extension.  There is mild tenderness of the collateral ligaments of the PIP joint, but PIP joint is stable.  Neurological: She is alert.  Skin: Skin is warm and dry.  Nursing note and  vitals reviewed.    UC Treatments / Results  Labs (all labs ordered are listed, but only abnormal results are displayed) Labs Reviewed - No data to display  EKG  EKG Interpretation None       Radiology Dg Finger Little Right  Result Date: 05/01/2016 CLINICAL DATA:  Injury 3 weeks ago.  Pain in the IP joint. EXAM: RIGHT LITTLE FINGER 2+V COMPARISON:  None. FINDINGS: There is no evidence of fracture or dislocation. There is no evidence of arthropathy or other focal bone abnormality. Soft tissues are unremarkable. IMPRESSION: Negative. Electronically Signed   By: Kennith Center M.D.   On: 05/01/2016 09:15    Procedures Procedures (including critical care time)  Medications Ordered in UC Medications - No data to display   Initial Impression / Assessment and Plan / UC Course  I have reviewed the triage vital signs and the nursing notes.  Pertinent labs & imaging results that were available during my care of the patient were reviewed by me and considered in my medical decision making (see chart for details).    Finger sprain appears to be healing well. Begin finger exercises. Followup with Dr. Rodney Langton or Dr. Clementeen Graham ([facility]) if not improving about two weeks.     Final Clinical Impressions(s) / UC Diagnoses   Final diagnoses:  Sprain of interphalangeal joint of right little finger, initial encounter    New Prescriptions Discharge Medication List as of 05/01/2016  9:38 AM       Lattie Haw, MD 05/01/16 1146

## 2017-02-02 DIAGNOSIS — H10022 Other mucopurulent conjunctivitis, left eye: Secondary | ICD-10-CM | POA: Diagnosis not present

## 2017-02-05 DIAGNOSIS — H1030 Unspecified acute conjunctivitis, unspecified eye: Secondary | ICD-10-CM | POA: Diagnosis not present

## 2017-11-18 DIAGNOSIS — Z0100 Encounter for examination of eyes and vision without abnormal findings: Secondary | ICD-10-CM | POA: Diagnosis not present

## 2018-07-01 DIAGNOSIS — M9904 Segmental and somatic dysfunction of sacral region: Secondary | ICD-10-CM | POA: Diagnosis not present

## 2018-07-01 DIAGNOSIS — M9902 Segmental and somatic dysfunction of thoracic region: Secondary | ICD-10-CM | POA: Diagnosis not present

## 2018-07-01 DIAGNOSIS — M4126 Other idiopathic scoliosis, lumbar region: Secondary | ICD-10-CM | POA: Diagnosis not present

## 2018-07-01 DIAGNOSIS — M545 Low back pain: Secondary | ICD-10-CM | POA: Diagnosis not present

## 2018-07-01 DIAGNOSIS — M9903 Segmental and somatic dysfunction of lumbar region: Secondary | ICD-10-CM | POA: Diagnosis not present

## 2018-07-01 DIAGNOSIS — M9901 Segmental and somatic dysfunction of cervical region: Secondary | ICD-10-CM | POA: Diagnosis not present

## 2018-07-10 DIAGNOSIS — M545 Low back pain: Secondary | ICD-10-CM | POA: Diagnosis not present

## 2018-07-10 DIAGNOSIS — M9903 Segmental and somatic dysfunction of lumbar region: Secondary | ICD-10-CM | POA: Diagnosis not present

## 2018-07-10 DIAGNOSIS — M9904 Segmental and somatic dysfunction of sacral region: Secondary | ICD-10-CM | POA: Diagnosis not present

## 2018-07-10 DIAGNOSIS — M4126 Other idiopathic scoliosis, lumbar region: Secondary | ICD-10-CM | POA: Diagnosis not present

## 2018-07-10 DIAGNOSIS — M9901 Segmental and somatic dysfunction of cervical region: Secondary | ICD-10-CM | POA: Diagnosis not present

## 2018-07-10 DIAGNOSIS — M9902 Segmental and somatic dysfunction of thoracic region: Secondary | ICD-10-CM | POA: Diagnosis not present

## 2018-09-01 ENCOUNTER — Ambulatory Visit (INDEPENDENT_AMBULATORY_CARE_PROVIDER_SITE_OTHER): Payer: Medicare HMO | Admitting: Sports Medicine

## 2018-09-01 ENCOUNTER — Encounter: Payer: Self-pay | Admitting: Sports Medicine

## 2018-09-01 ENCOUNTER — Ambulatory Visit (INDEPENDENT_AMBULATORY_CARE_PROVIDER_SITE_OTHER): Payer: Medicare HMO

## 2018-09-01 ENCOUNTER — Other Ambulatory Visit: Payer: Self-pay

## 2018-09-01 DIAGNOSIS — M25512 Pain in left shoulder: Secondary | ICD-10-CM

## 2018-09-01 DIAGNOSIS — S4992XA Unspecified injury of left shoulder and upper arm, initial encounter: Secondary | ICD-10-CM | POA: Diagnosis not present

## 2018-09-01 NOTE — Progress Notes (Signed)
Subjective:    CC: Left shoulder pain  HPI: For a week now this pleasant 68 year old female has had pain that she localizes over her deltoid after bumping into something.  She has developed progressive loss of range of motion, she can only abduct to 10 to 20 degrees.  Symptoms are moderate, persistent, localized without radiation.  I reviewed the past medical history, family history, social history, surgical history, and allergies today and no changes were needed.  Please see the problem list section below in epic for further details.  Past Medical History: No past medical history on file. Past Surgical History: Past Surgical History:  Procedure Laterality Date  . BUNIONECTOMY     right foot.   Social History: Social History   Socioeconomic History  . Marital status: Married    Spouse name: Not on file  . Number of children: Not on file  . Years of education: Not on file  . Highest education level: Not on file  Occupational History  . Not on file  Social Needs  . Financial resource strain: Not on file  . Food insecurity    Worry: Not on file    Inability: Not on file  . Transportation needs    Medical: Not on file    Non-medical: Not on file  Tobacco Use  . Smoking status: Never Smoker  . Smokeless tobacco: Never Used  Substance and Sexual Activity  . Alcohol use: No  . Drug use: No  . Sexual activity: Not on file  Lifestyle  . Physical activity    Days per week: Not on file    Minutes per session: Not on file  . Stress: Not on file  Relationships  . Social Musicianconnections    Talks on phone: Not on file    Gets together: Not on file    Attends religious service: Not on file    Active member of club or organization: Not on file    Attends meetings of clubs or organizations: Not on file    Relationship status: Not on file  Other Topics Concern  . Not on file  Social History Narrative  . Not on file   Family History: Family History  Problem Relation Age of  Onset  . Heart disease Mother   . Heart disease Father   . Heart attack Father   . Aortic aneurysm Father   . Hyperlipidemia Father   . Heart attack Maternal Uncle   . Heart attack Maternal Grandmother   . Heart attack Paternal Grandfather    Allergies: No Known Allergies Medications: See med rec.  Review of Systems: No fevers, chills, night sweats, weight loss, chest pain, or shortness of breath.   Objective:    General: Well Developed, well nourished, and in no acute distress.  Neuro: Alert and oriented x3, extra-ocular muscles intact, sensation grossly intact.  HEENT: Normocephalic, atraumatic, pupils equal round reactive to light, neck supple, no masses, no lymphadenopathy, thyroid nonpalpable.  Skin: Warm and dry, no rashes. Cardiac: Regular rate and rhythm, no murmurs rubs or gallops, no lower extremity edema.  Respiratory: Clear to auscultation bilaterally. Not using accessory muscles, speaking in full sentences. Left shoulder: External rotation to 10 degrees, abduction to 20 degrees.  Procedure: Real-time Ultrasound Guided injection of the left glenohumeral joint Device: GE Logiq E  Verbal informed consent obtained.  Time-out conducted.  Noted no overlying erythema, induration, or other signs of local infection.  Skin prepped in a sterile fashion.  Local anesthesia: Topical  Ethyl chloride.  With sterile technique and under real time ultrasound guidance:  1 cc Kenalog 40, 2 cc lidocaine, 2 cc bupivacaine injected easily from a posterior approach with a 22-gauge spinal needle. Completed without difficulty  Pain immediately resolved suggesting accurate placement of the medication.  Advised to call if fevers/chills, erythema, induration, drainage, or persistent bleeding.  Images permanently stored and available for review in the ultrasound unit.  Impression: Technically successful ultrasound guided injection.  Impression and Recommendations:    Left shoulder pain  Relatively acute, evolved over a month. She has significant loss of range of motion. Pain is waking her from sleep. Starting aggressively with a glenohumeral injection. Formal PT, x-rays, return to see me in a month.   ___________________________________________ Gwen Her. Dianah Field, M.D., ABFM., CAQSM. Primary Care and Sports Medicine [facility]  Adjunct Professor of [place] [employer] [facility] [employer]

## 2018-09-01 NOTE — Assessment & Plan Note (Signed)
Relatively acute, evolved over a month. She has significant loss of range of motion. Pain is waking her from sleep. Starting aggressively with a glenohumeral injection. Formal PT, x-rays, return to see me in a month.

## 2018-09-04 ENCOUNTER — Ambulatory Visit: Payer: Medicare HMO | Admitting: Rehabilitative and Restorative Service Providers"

## 2018-09-04 ENCOUNTER — Encounter: Payer: Self-pay | Admitting: Rehabilitative and Restorative Service Providers"

## 2018-09-04 ENCOUNTER — Other Ambulatory Visit: Payer: Self-pay

## 2018-09-04 DIAGNOSIS — R293 Abnormal posture: Secondary | ICD-10-CM

## 2018-09-04 DIAGNOSIS — R29898 Other symptoms and signs involving the musculoskeletal system: Secondary | ICD-10-CM

## 2018-09-04 DIAGNOSIS — M25512 Pain in left shoulder: Secondary | ICD-10-CM | POA: Diagnosis not present

## 2018-09-04 NOTE — Patient Instructions (Signed)
Access Code: [email or web address]  URL: [email or web address]  Date: 09/04/2018  Prepared by: Gillermo Murdoch   Exercises  Seated Cervical Retraction - 10 reps - 1 sets - 3x daily - 7x weekly  Standing Scapular Retraction - 10 reps - 1 sets - 10 hold - 3x daily - 7x weekly  Shoulder External Rotation and Scapular Retraction - 10 reps - 1 sets - hold - 3x daily - 7x weekly  Seated Shoulder Flexion AAROM with Pulley Behind - 10 reps - 1 sets - 10 sec hold - 3x daily - 7x weekly  Seated Shoulder Abduction AAROM with Pulley at Side - 10 reps - 1 sets - 10 sec hold - 3x daily - 7x weekly

## 2018-09-04 NOTE — Therapy (Signed)
[facility] [address] [place], [place], [address] Phone: [phone]   Fax:  [phone]  Physical Therapy Evaluation  Patient Details  Name: Kelli Kindleenny B Abadi MRN: [id number] Date of Birth: 1950-03-14 Referring Provider (PT): Dr Aviva Signshekkenandam    Encounter Date: 09/04/2018  PT End of Session - 09/04/18 1144    Visit Number  1    Number of Visits  12    Date for PT Re-Evaluation  10/16/18    PT Start Time  1058    PT Stop Time  1148    PT Time Calculation (min)  50 min    Activity Tolerance  Patient tolerated treatment well       History reviewed. No pertinent past medical history.  Past Surgical History:  Procedure Laterality Date  . BUNIONECTOMY     right foot.    There were no vitals filed for this visit.   Subjective Assessment - 09/04/18 1053    Subjective  Patient reports that she bumped into the wall at home ~ 1 month ago. Did not notice any problems for a week or two. Then shoulder began to hurt with certain motions and she had difficulty sleeping.    Pertinent History  denies any medical problems    Patient Stated Goals  get [place] to work    Currently in Pain?  Yes    Pain Location  Shoulder    Pain Orientation  Left    Pain Descriptors / Indicators  Aching;Dull;Sharp   sharp with movement   Pain Type  Acute pain    Pain Radiating Towards  down into arm    Pain Onset  More than a month ago    Pain Frequency  Constant   dull ache constant   Aggravating Factors   moving Lt arm; lying on Lt side; dressing; bathing; ADL;s    Pain Relieving Factors  nothing         [facility] PT Assessment - 09/04/18 0001      Assessment   Medical Diagnosis  Lt shoudler pain     Referring Provider (PT)  Dr Aviva Signshekkenandam     Onset Date/Surgical Date  07/15/18    Hand Dominance  Right    Prior Therapy  none       Precautions   Precautions  None      Balance Screen   Has the patient fallen in the past 6  months  No    Has the patient had a decrease in activity level because of a fear of falling?   No    Is the patient reluctant to leave their home because of a fear of falling?   No      Prior Function   Level of Independence  Independent    Vocation  Full time employment    Vocation Requirements  inside sales - desk/computer x 38 yrs     Leisure  household chores; walking 2-3 times/wk 45 min       Observation/Other Assessments   Focus on Therapeutic Outcomes (FOTO)   43% limitation       Sensation   Additional Comments  WFL's per pt report       Posture/Postural Control   Posture Comments  head forward; shoulders rounded and eelvated; head of the humerus anterior in orientation; scapulae abducted and rotated along the thoracic wall       AROM   Right Shoulder Extension  70 Degrees    Right  Shoulder Flexion  148 Degrees    Right Shoulder ABduction  173 Degrees    Right Shoulder Internal Rotation  28 Degrees    Right Shoulder External Rotation  90 Degrees    Left Shoulder Extension  37 Degrees    Left Shoulder Flexion  109 Degrees    Left Shoulder ABduction  63 Degrees    Left Shoulder Internal Rotation  --   Lt hand to lateral hip    Left Shoulder External Rotation  24 Degrees      Strength   Overall Strength Comments  moves UE through available range - not tested resistively       Palpation   Spinal mobility  hypomobile thoracic spine with CPA mobs                 Objective measurements completed on examination: See above findings.      [facility] Adult PT Treatment/Exercise - 09/04/18 0001      Therapeutic Activites    Therapeutic Activities  --   myofacial ball release work standing      Shoulder Exercises: Standing   Other Standing Exercises  axial extension 10 sec x 5; scap squeeze 10 sec x 5; L's x 10 with noodle       Shoulder Exercises: Pulleys   Flexion  --   10 reps 10 sec hold    Scaption  --   10 sec x 10 reps      Moist Heat Therapy   Number  Minutes Moist Heat  15 Minutes    Moist Heat Location  Shoulder   Lt      Electrical Stimulation   Electrical Stimulation Location  Lt shoulder girdle    Electrical Stimulation Action  IFC    Electrical Stimulation Parameters  to tolerance     Electrical Stimulation Goals  Pain;Tone             PT Education - 09/04/18 1139    Education Details  HEP    Person(s) Educated  Patient    Methods  Explanation;Demonstration;Tactile cues;Verbal cues;Handout          PT Long Term Goals - 09/04/18 1149      PT LONG TERM GOAL #1   Title  Decrease Lt shoulder pain by 50-75% with all functional activities    Time  6    Period  Weeks    Status  New    Target Date  10/16/18      PT LONG TERM GOAL #2   Title  Increase Lt shoulder ROM to equal AROM Rt shoulder    Time  6    Period  Weeks    Status  New    Target Date  10/16/18      PT LONG TERM GOAL #3   Title  Improve posture and alignment with patient to demonstrate improved upright posture with engagement of posterior shoudler girdle musculature    Time  6    Period  Weeks    Status  New    Target Date  10/16/18      PT LONG TERM GOAL #4   Title  Independent in HEP    Time  6    Period  Weeks    Status  New    Target Date  10/16/18      PT LONG TERM GOAL #5   Title  Improve FOTO to </= 30% limitation    Time  6    Period  Weeks    Status  New    Target Date  10/16/18             Plan - 09/04/18 1138    Clinical Impression Statement  Kelli Marquez presents with Lt shoulder pain for the past 1-2 months. Signs and symptoms are consistent with adhesive capsulitis. Patient has poor posture and alignment; limited shoudler ORM; muscular tightness to palpation; poor scapular stability; pain with functional activities. She will benefit from PT to address problems identified.    Stability/Clinical Decision Making  Stable/Uncomplicated    Clinical Decision Making  Low    Rehab Potential  Good    PT Frequency  2x / week     PT Duration  6 weeks    PT Treatment/Interventions  Patient/family education;ADLs/Self Care Home Management;Cryotherapy;Electrical Stimulation;Iontophoresis 4mg /ml Dexamethasone;Moist Heat;Ultrasound;Manual techniques;Dry needling;Therapeutic activities;Therapeutic exercise    PT Next Visit Plan  review HEP; progress with AAROM; scapular stabilization; prolonged snow angel; manual work; PROM; modalities as indicated    PT Home Exercise Plan  [email or web address]    Consulted and Agree with Plan of Care  Patient       Patient will benefit from skilled therapeutic intervention in order to improve the following deficits and impairments:  Pain, Decreased activity tolerance, Decreased mobility, Decreased range of motion, Decreased strength, Impaired UE functional use, Increased fascial restricitons, Increased muscle spasms  Visit Diagnosis: Acute pain of left shoulder - Plan: PT plan of care cert/re-cert  Other symptoms and signs involving the musculoskeletal system - Plan: PT plan of care cert/re-cert  Abnormal posture - Plan: PT plan of care cert/re-cert     Problem List Patient Active Problem List   Diagnosis Date Noted  . Left shoulder pain 09/01/2018  . Family history of abdominal aortic aneurysm 05/31/2015  . Epigastric pressure 05/31/2015  . PVC's (premature ventricular contractions) 03/01/2014  . Hyperlipemia 03/01/2014  . Pre-diabetes 01/16/2014  . Heart palpitations 01/11/2014  . Cold intolerance 01/11/2014  . ERYTHEMA DUE TO BURN OF MULTIPLE SPECIFIED SITES 01/19/2010  . ACUTE BRONCHITIS 01/27/2009  . COUGH 01/27/2009  . HYPERLIPIDEMIA 05/25/2007  . URINARY TRACT INFECTION SITE NOT SPECIFIED 02/10/2007  . POSTMENOPAUSAL STATUS 01/29/2007  . FATIGUE 01/29/2007    Kelli Marquez Nilda Simmer PT, MPH  09/04/2018, 3:30 PM  [facility] [place], [place], [address] Phone: [phone]   Fax:  [phone]  Name: Kelli Marquez MRN: [id number] Date of Birth: 09-12-1950

## 2018-09-10 ENCOUNTER — Other Ambulatory Visit: Payer: Self-pay

## 2018-09-10 ENCOUNTER — Encounter: Payer: Self-pay | Admitting: Rehabilitative and Restorative Service Providers"

## 2018-09-10 ENCOUNTER — Ambulatory Visit: Payer: Medicare HMO | Admitting: Rehabilitative and Restorative Service Providers"

## 2018-09-10 DIAGNOSIS — R29898 Other symptoms and signs involving the musculoskeletal system: Secondary | ICD-10-CM

## 2018-09-10 DIAGNOSIS — R293 Abnormal posture: Secondary | ICD-10-CM

## 2018-09-10 DIAGNOSIS — M25512 Pain in left shoulder: Secondary | ICD-10-CM

## 2018-09-10 NOTE — Patient Instructions (Addendum)
SUPINE Tips A    Being in the supine position means to be lying on the back. Lying on the back is the position of least compression on the bones and discs of the spine, and helps to re-align the natural curves of the back. Working toward 2-5 min - can bend elbows to release stretch as needed    Access Code: [email or web address]  URL: [email or web address]  Date: 09/10/2018  Prepared by: Gillermo Murdoch   Exercises  Seated Cervical Retraction - 10 reps - 1 sets - 3x daily - 7x weekly  Standing Scapular Retraction - 10 reps - 1 sets - 10 hold - 3x daily - 7x weekly  Shoulder External Rotation and Scapular Retraction - 10 reps - 1 sets - hold - 3x daily - 7x weekly  Seated Shoulder Flexion AAROM with Pulley Behind - 10 reps - 1 sets - 10 sec hold - 3x daily - 7x weekly  Seated Shoulder Abduction AAROM with Pulley at Side - 10 reps - 1 sets - 10 sec hold - 3x daily - 7x weekly  Standing 'L' Stretch at Counter - 5 reps - 1 sets - 10 sec hold - 2x daily - 7x weekly  Shoulder External Rotation and Scapular Retraction with Resistance - 10 reps - 1-3 sets - 1x daily - 7x weekly  Scapular Retraction with Resistance - 10 reps - 1-3 sets - 1x daily - 7x weekly  Scapular Retraction with Resistance Advanced - 10 reps - 1-3 sets - 1x daily - 7x weekly

## 2018-09-10 NOTE — Therapy (Signed)
[facility] [address] [place], [place], [address] Phone: [phone]   Fax:  [phone]  Physical Therapy Treatment  Patient Details  Name: Kelli Marquez MRN: [id number] Date of Birth: [birth year] Referring Provider (PT): Dr Aviva Signshekkenandam    Encounter Date: 09/10/2018  PT End of Session - 09/10/18 1147    Visit Number  2    Number of Visits  12    Date for PT Re-Evaluation  10/16/18    PT Start Time  1145    PT Stop Time  1233    PT Time Calculation (min)  48 min    Activity Tolerance  Patient tolerated treatment well       History reviewed. No pertinent past medical history.  Past Surgical History:  Procedure Laterality Date  . BUNIONECTOMY     right foot.    There were no vitals filed for this visit.  Subjective Assessment - 09/10/18 1148    Subjective  Patient reports improvement. She is still awaiting the pulley for home. Working on exercises at home. Sleeping better    Currently in Pain?  No/denies    Pain Orientation  Left    Pain Descriptors / Indicators  Aching;Dull;Sharp    Pain Type  Acute pain    Pain Onset  More than a month ago    Pain Frequency  Intermittent                       [facility] Adult PT Treatment/Exercise - 09/10/18 0001      Shoulder Exercises: Standing   Extension  Strengthening;Both;10 reps;Theraband    Theraband Level (Shoulder Extension)  Level 2 (Red)    Row  Strengthening;Both;10 reps;Theraband    Theraband Level (Shoulder Row)  Level 2 (Red)    Retraction  Strengthening;Both;10 reps;Theraband   minimal movement - painfree range    Theraband Level (Shoulder Retraction)  Level 1 (Yellow)    Other Standing Exercises  axial extension 10 sec x 5; scap squeeze 10 sec x 5; L's x 10 with noodle       Shoulder Exercises: Pulleys   Flexion  --   10 reps 10 sec hold    Scaption  --   10 sec x 10 reps      Shoulder Exercises: Stretch   Table Stretch -  Flexion  5 reps;10 seconds   hands on counter stepping back    Other Shoulder Stretches  hands behind head in supine ~ 30 sec x 2 reps     Other Shoulder Stretches  snow angle prolonged stretch supine shds ~ 75 deg abd ~ 2 min       Moist Heat Therapy   Number Minutes Moist Heat  10 Minutes    Moist Heat Location  Shoulder   Lt      Manual Therapy   Manual therapy comments  pt supine     Joint Mobilization  GH joint mobs multiple planes     Soft tissue mobilization  deep tissue work through the posterior scapular area; upper trap; pecs; teres Lt upper quarter     Scapular Mobilization  Lt     Passive ROM  Lt shoulder flexion; scaption; ER in scapular plane; IR; ext; horizontal abdcution     Manual Traction  through Lt UR in neutral position 2-3 reps with jiggle              PT Education - 09/10/18 1207  Education Details  HEP    Person(s) Educated  Patient    Methods  Explanation;Demonstration;Tactile cues;Verbal cues;Handout    Comprehension  Verbalized understanding;Returned demonstration;Verbal cues required;Tactile cues required          PT Long Term Goals - 09/04/18 1149      PT LONG TERM GOAL #1   Title  Decrease Lt shoulder pain by 50-75% with all functional activities    Time  6    Period  Weeks    Status  New    Target Date  10/16/18      PT LONG TERM GOAL #2   Title  Increase Lt shoulder ROM to equal AROM Rt shoulder    Time  6    Period  Weeks    Status  New    Target Date  10/16/18      PT LONG TERM GOAL #3   Title  Improve posture and alignment with patient to demonstrate improved upright posture with engagement of posterior shoudler girdle musculature    Time  6    Period  Weeks    Status  New    Target Date  10/16/18      PT LONG TERM GOAL #4   Title  Independent in HEP    Time  6    Period  Weeks    Status  New    Target Date  10/16/18      PT LONG TERM GOAL #5   Title  Improve FOTO to </= 30% limitation    Time  6    Period   Weeks    Status  New    Target Date  10/16/18            Plan - 09/10/18 1148    Clinical Impression Statement  Good response to initial treatment and HEP. Decreased pain - no longer constant pain, improved sleep.Added exercises without difficulty. Tolerated manual work and PROM well.    Stability/Clinical Decision Making  Stable/Uncomplicated    Rehab Potential  Good    PT Frequency  2x / week    PT Duration  6 weeks    PT Treatment/Interventions  Patient/family education;ADLs/Self Care Home Management;Cryotherapy;Electrical Stimulation;Iontophoresis 4mg /ml Dexamethasone;Moist Heat;Ultrasound;Manual techniques;Dry needling;Therapeutic activities;Therapeutic exercise    PT Next Visit Plan  review HEP; progress with AAROM; scapular stabilization; manual work; PROM; modalities as indicated - add IR with strap; wall slide; pec stretch?    PT Home Exercise Plan  [email or web address]    Consulted and Agree with Plan of Care  Patient       Patient will benefit from skilled therapeutic intervention in order to improve the following deficits and impairments:  Pain, Decreased activity tolerance, Decreased mobility, Decreased range of motion, Decreased strength, Impaired UE functional use, Increased fascial restricitons, Increased muscle spasms  Visit Diagnosis: Acute pain of left shoulder  Other symptoms and signs involving the musculoskeletal system  Abnormal posture     Problem List Patient Active Problem List   Diagnosis Date Noted  . Left shoulder pain 09/01/2018  . Family history of abdominal aortic aneurysm 05/31/2015  . Epigastric pressure 05/31/2015  . PVC's (premature ventricular contractions) 03/01/2014  . Hyperlipemia 03/01/2014  . Pre-diabetes 01/16/2014  . Heart palpitations 01/11/2014  . Cold intolerance 01/11/2014  . ERYTHEMA DUE TO BURN OF MULTIPLE SPECIFIED SITES 01/19/2010  . ACUTE BRONCHITIS 01/27/2009  . COUGH 01/27/2009  . HYPERLIPIDEMIA 05/25/2007  . URINARY TRACT  INFECTION SITE NOT SPECIFIED 02/10/2007  . POSTMENOPAUSAL  STATUS 01/29/2007  . FATIGUE 01/29/2007    Niraj Kudrna Nilda Simmer PT, MPH  09/10/2018, 12:35 PM  [facility] [place], [place], [address] Phone: [phone]   Fax:  [phone]  Name: Kelli Marquez MRN: [id number] Date of Birth: Jul 04, 1950

## 2018-09-16 ENCOUNTER — Other Ambulatory Visit: Payer: Self-pay

## 2018-09-16 ENCOUNTER — Ambulatory Visit: Payer: Medicare HMO | Admitting: Rehabilitative and Restorative Service Providers"

## 2018-09-16 ENCOUNTER — Encounter: Payer: Self-pay | Admitting: Rehabilitative and Restorative Service Providers"

## 2018-09-16 DIAGNOSIS — M25512 Pain in left shoulder: Secondary | ICD-10-CM | POA: Diagnosis not present

## 2018-09-16 DIAGNOSIS — R29898 Other symptoms and signs involving the musculoskeletal system: Secondary | ICD-10-CM

## 2018-09-16 DIAGNOSIS — R293 Abnormal posture: Secondary | ICD-10-CM | POA: Diagnosis not present

## 2018-09-16 NOTE — Patient Instructions (Signed)
Access Code: [email or web address]  URL: [email or web address]  Date: 09/16/2018  Prepared by: Gillermo Murdoch   Exercises  Seated Cervical Retraction - 10 reps - 1 sets - 3x daily - 7x weekly  Standing Scapular Retraction - 10 reps - 1 sets - 10 hold - 3x daily - 7x weekly  Shoulder External Rotation and Scapular Retraction - 10 reps - 1 sets - hold - 3x daily - 7x weekly  Seated Shoulder Flexion AAROM with Pulley Behind - 10 reps - 1 sets - 10 sec hold - 3x daily - 7x weekly  Seated Shoulder Abduction AAROM with Pulley at Side - 10 reps - 1 sets - 10 sec hold - 3x daily - 7x weekly  Standing 'L' Stretch at [employer] - 5 reps - 1 sets - 10 sec hold - 2x daily - 7x weekly  Shoulder External Rotation and Scapular Retraction with Resistance - 10 reps - 1-3 sets - 1x daily - 7x weekly  Scapular Retraction with Resistance - 10 reps - 1-3 sets - 1x daily - 7x weekly  Scapular Retraction with Resistance Advanced - 10 reps - 1-3 sets - 1x daily - 7x weekly  Doorway Pec Stretch at 60 Degrees Abduction - 3 reps - 1 sets - 3x daily - 7x weekly  Doorway Pec Stretch at 90 Degrees Abduction - 3 reps - 1 sets - 30 seconds hold - 3x daily - 7x weekly  Doorway Pec Stretch at 120 Degrees Abduction - 3 reps - 1 sets - 30 second hold hold - 3x daily - 7x weekly  Standing Shoulder Internal Rotation Stretch with Towel - 3 reps - 1 sets - 30 sec hold - 2x daily - 7x weekly  Wall Push Up - 10 reps - 1-2 sets - 3-4sec hold - 1x daily - 7x weekly  Cat to Child's Pose with Posterior Pelvic Tilt - 3 reps - 1 sets - 30 sec hold - 2x daily - 7x weekly

## 2018-09-16 NOTE — Therapy (Addendum)
[place], [place], [address] Phone: [phone]   Fax:  [phone]  Physical Therapy Treatment  Patient Details  Name: Kelli Marquez MRN: [id number] Date of Birth: January 22, 1950 Referring Provider (PT): Dr Cresenciano Lick    Encounter Date: 09/16/2018  PT End of Session - 09/16/18 1148    Visit Number  3    Number of Visits  12    Date for PT Re-Evaluation  10/16/18    PT Start Time  9432    PT Stop Time  1234    PT Time Calculation (min)  49 min    Activity Tolerance  Patient tolerated treatment well       History reviewed. No pertinent past medical history.  Past Surgical History:  Procedure Laterality Date  . BUNIONECTOMY     right foot.    There were no vitals filed for this visit.  Subjective Assessment - 09/16/18 1150    Subjective  Patient reports that her husband made her a pulley for home. She is working on [employer]. Sleeping well. Has pain with certain motions but movement is improving. Making progress.    Currently in Pain?  No/denies                        [facility] Adult PT Treatment/Exercise - 09/16/18 0001      Shoulder Exercises: Standing   Extension  Strengthening;Both;10 reps;Theraband    Theraband Level (Shoulder Extension)  Level 3 (Green)    Row  Strengthening;Both;10 reps;Theraband    Theraband Level (Shoulder Row)  Level 3 (Green)    Row Limitations  bow and arrow green TB x 10 each UE     Retraction  Strengthening;Both;10 reps;Theraband   minimal movement - painfree range    Theraband Level (Shoulder Retraction)  Level 2 (Red)    Other Standing Exercises  axial extension 10 sec x 5; scap squeeze 10 sec x 5; L's x 10 with noodle     Other Standing Exercises  wall push ups x 10       Shoulder Exercises: Pulleys   Flexion  --   10 reps 10 sec hold    Scaption  --   10 sec x 10 reps      Shoulder Exercises: Stretch   Internal Rotation Stretch  3 reps    20-30 sec hold with strap    Table Stretch - Flexion  5 reps;10 seconds   hands on counter stepping back    Other Shoulder Stretches  hands behind head in supine ~ 30 sec x 2 reps     Other Shoulder Stretches  doorway stretch 3 positions 30 sec x 3 reps each position       Moist Heat Therapy   Number Minutes Moist Heat  12 Minutes    Moist Heat Location  Shoulder   Lt      Manual Therapy   Manual therapy comments  pt supine     Joint Mobilization  GH joint mobs multiple planes     Soft tissue mobilization  deep tissue work through pecs; posterior scapular area; upper trap; teres Lt upper quarter     Scapular Mobilization  Lt     Passive ROM  Lt shoulder flexion; scaption; ER in scapular plane; IR; ext; horizontal abdcution     Manual Traction  through Lt UR in neutral position 2-3 reps with jiggle  PT Education - 09/16/18 1214    Education Details  HEP    Person(s) Educated  Patient    Methods  Explanation;Demonstration;Tactile cues;Verbal cues;Handout    Comprehension  Verbalized understanding;Returned demonstration;Verbal cues required;Tactile cues required          PT Long Term Goals - 09/04/18 1149      PT LONG TERM GOAL #1   Title  Decrease Lt shoulder pain by 50-75% with all functional activities    Time  6    Period  Weeks    Status  New    Target Date  10/16/18      PT LONG TERM GOAL #2   Title  Increase Lt shoulder ROM to equal AROM Rt shoulder    Time  6    Period  Weeks    Status  New    Target Date  10/16/18      PT LONG TERM GOAL #3   Title  Improve posture and alignment with patient to demonstrate improved upright posture with engagement of posterior shoudler girdle musculature    Time  6    Period  Weeks    Status  New    Target Date  10/16/18      PT LONG TERM GOAL #4   Title  Independent in HEP    Time  6    Period  Weeks    Status  New    Target Date  10/16/18      PT LONG TERM GOAL #5   Title  Improve FOTO to </=  30% limitation    Time  6    Period  Weeks    Status  New    Target Date  10/16/18            Plan - 09/16/18 1148    Clinical Impression Statement  Continued progress with decreased pain and increased mobility and functional use of Lt UE. Continues to demonstrate end range tightness but progressing well toward goals of rehab. Added exercises without difficulty.    Stability/Clinical Decision Making  Stable/Uncomplicated    Rehab Potential  Good    PT Frequency  2x / week    PT Duration  6 weeks    PT Treatment/Interventions  Patient/family education;ADLs/Self Care Home Management;Cryotherapy;Electrical Stimulation;Iontophoresis 12m/ml Dexamethasone;Moist Heat;Ultrasound;Manual techniques;Dry needling;Therapeutic activities;Therapeutic exercise    PT Next Visit Plan  review HEP; progress with AAROM; scapular stabilization; manual work; PROM; modalities as indicated MD note at next visit    PT HGautierand Agree with Plan of Care  Patient       Patient will benefit from skilled therapeutic intervention in order to improve the following deficits and impairments:  Pain, Decreased activity tolerance, Decreased mobility, Decreased range of motion, Decreased strength, Impaired UE functional use, Increased fascial restricitons, Increased muscle spasms  Visit Diagnosis: Acute pain of left shoulder  Other symptoms and signs involving the musculoskeletal system  Abnormal posture     Problem List Patient Active Problem List   Diagnosis Date Noted  . Left shoulder pain 09/01/2018  . Family history of abdominal aortic aneurysm 05/31/2015  . Epigastric pressure 05/31/2015  . PVC's (premature ventricular contractions) 03/01/2014  . Hyperlipemia 03/01/2014  . Pre-diabetes 01/16/2014  . Heart palpitations 01/11/2014  . Cold intolerance 01/11/2014  . ERYTHEMA DUE TO BURN OF MULTIPLE SPECIFIED SITES 01/19/2010  . ACUTE BRONCHITIS 01/27/2009  . COUGH  01/27/2009  . HYPERLIPIDEMIA 05/25/2007  .  URINARY TRACT INFECTION SITE NOT SPECIFIED 02/10/2007  . POSTMENOPAUSAL STATUS 01/29/2007  . FATIGUE 01/29/2007    Celyn Nilda Simmer PT, MPH  09/16/2018, 12:41 PM  [facility] [place], [place], [address] Phone: [phone]   Fax:  [phone]  Name: Kelli Marquez MRN: [id number] Date of Birth: 29-Oct-1950  PHYSICAL THERAPY DISCHARGE SUMMARY  Visits from Start of Care: 3  Current functional level related to goals / functional outcomes: See last progress note for discharge status    Remaining deficits: Patient needs to continue with HEP    Education / Equipment: HEP Plan: Patient agrees to discharge.  Patient goals were partially met. Patient is being discharged due to not returning since the last visit.  ?????     Celyn P. Helene Kelp PT, MPH 03/05/19 10:32 AM

## 2018-09-24 ENCOUNTER — Encounter: Payer: Medicare HMO | Admitting: Rehabilitative and Restorative Service Providers"

## 2018-09-29 ENCOUNTER — Encounter: Payer: Self-pay | Admitting: Sports Medicine

## 2018-09-29 ENCOUNTER — Ambulatory Visit (INDEPENDENT_AMBULATORY_CARE_PROVIDER_SITE_OTHER): Payer: Medicare HMO | Admitting: Sports Medicine

## 2018-09-29 ENCOUNTER — Other Ambulatory Visit: Payer: Self-pay

## 2018-09-29 DIAGNOSIS — M25512 Pain in left shoulder: Secondary | ICD-10-CM | POA: Diagnosis not present

## 2018-09-29 NOTE — Progress Notes (Signed)
Subjective:    CC: Follow-up  HPI: Left shoulder pain: Clinically resembles a traumatic adhesive capsulitis, now currently resolved.  I reviewed the past medical history, family history, social history, surgical history, and allergies today and no changes were needed.  Please see the problem list section below in epic for further details.  Past Medical History: No past medical history on file. Past Surgical History: Past Surgical History:  Procedure Laterality Date  . BUNIONECTOMY     right foot.   Social History: Social History   Socioeconomic History  . Marital status: Married    Spouse name: Not on file  . Number of children: Not on file  . Years of education: Not on file  . Highest education level: Not on file  Occupational History  . Not on file  Social Needs  . Financial resource strain: Not on file  . Food insecurity    Worry: Not on file    Inability: Not on file  . Transportation needs    Medical: Not on file    Non-medical: Not on file  Tobacco Use  . Smoking status: Never Smoker  . Smokeless tobacco: Never Used  Substance and Sexual Activity  . Alcohol use: No  . Drug use: No  . Sexual activity: Not on file  Lifestyle  . Physical activity    Days per week: Not on file    Minutes per session: Not on file  . Stress: Not on file  Relationships  . Social Musicianconnections    Talks on phone: Not on file    Gets together: Not on file    Attends religious service: Not on file    Active member of club or organization: Not on file    Attends meetings of clubs or organizations: Not on file    Relationship status: Not on file  Other Topics Concern  . Not on file  Social History Narrative  . Not on file   Family History: Family History  Problem Relation Age of Onset  . Heart disease Mother   . Heart disease Father   . Heart attack Father   . Aortic aneurysm Father   . Hyperlipidemia Father   . Heart attack Maternal Uncle   . Heart attack Maternal  Grandmother   . Heart attack Paternal Grandfather    Allergies: No Known Allergies Medications: See med rec.  Review of Systems: No fevers, chills, night sweats, weight loss, chest pain, or shortness of breath.   Objective:    General: Well Developed, well nourished, and in no acute distress.  Neuro: Alert and oriented x3, extra-ocular muscles intact, sensation grossly intact.  HEENT: Normocephalic, atraumatic, pupils equal round reactive to light, neck supple, no masses, no lymphadenopathy, thyroid nonpalpable.  Skin: Warm and dry, no rashes. Cardiac: Regular rate and rhythm, no murmurs rubs or gallops, no lower extremity edema.  Respiratory: Clear to auscultation bilaterally. Not using accessory muscles, speaking in full sentences. Left shoulder: Inspection reveals no abnormalities, atrophy or asymmetry. Palpation is normal with no tenderness over AC joint or bicipital groove. ROM is full in all planes. Rotator cuff strength normal throughout. No signs of impingement with negative Neer and Hawkin's tests, empty can. Speeds and Yergason's tests normal. No labral pathology noted with negative Obrien's, negative crank, negative clunk, and good stability. Normal scapular function observed. No painful arc and no drop arm sign. No apprehension sign  Impression and Recommendations:    Left shoulder pain Kelli Marquez returns, I think she had a  traumatic adhesive capsulitis. We did a glenohumeral injection and formal PT and she returns for the most part completely pain-free.  Happy with how things are going, return as needed.   ___________________________________________ Gwen Her. Dianah Field, M.D., ABFM., CAQSM. Primary Care and Sports Medicine [facility]  Adjunct Professor of [employer]

## 2018-09-29 NOTE — Assessment & Plan Note (Signed)
Ebony returns, I think she had a traumatic adhesive capsulitis. We did a glenohumeral injection and formal PT and she returns for the most part completely pain-free.  Happy with how things are going, return as needed.

## 2019-02-10 ENCOUNTER — Encounter: Payer: Self-pay | Admitting: Physician Assistant

## 2019-02-14 DIAGNOSIS — Z20828 Contact with and (suspected) exposure to other viral communicable diseases: Secondary | ICD-10-CM | POA: Diagnosis not present

## 2019-05-18 DIAGNOSIS — R69 Illness, unspecified: Secondary | ICD-10-CM | POA: Diagnosis not present

## 2021-06-19 IMAGING — DX LEFT SHOULDER - 2+ VIEW
3 series · 3 of 3 positions shown · non-contrast
Comparison: None.

CLINICAL DATA: Left shoulder pain after injury 1 month ago running
into a door frame.

EXAM:
LEFT SHOULDER - 2+ VIEW

[shoulder grashey]
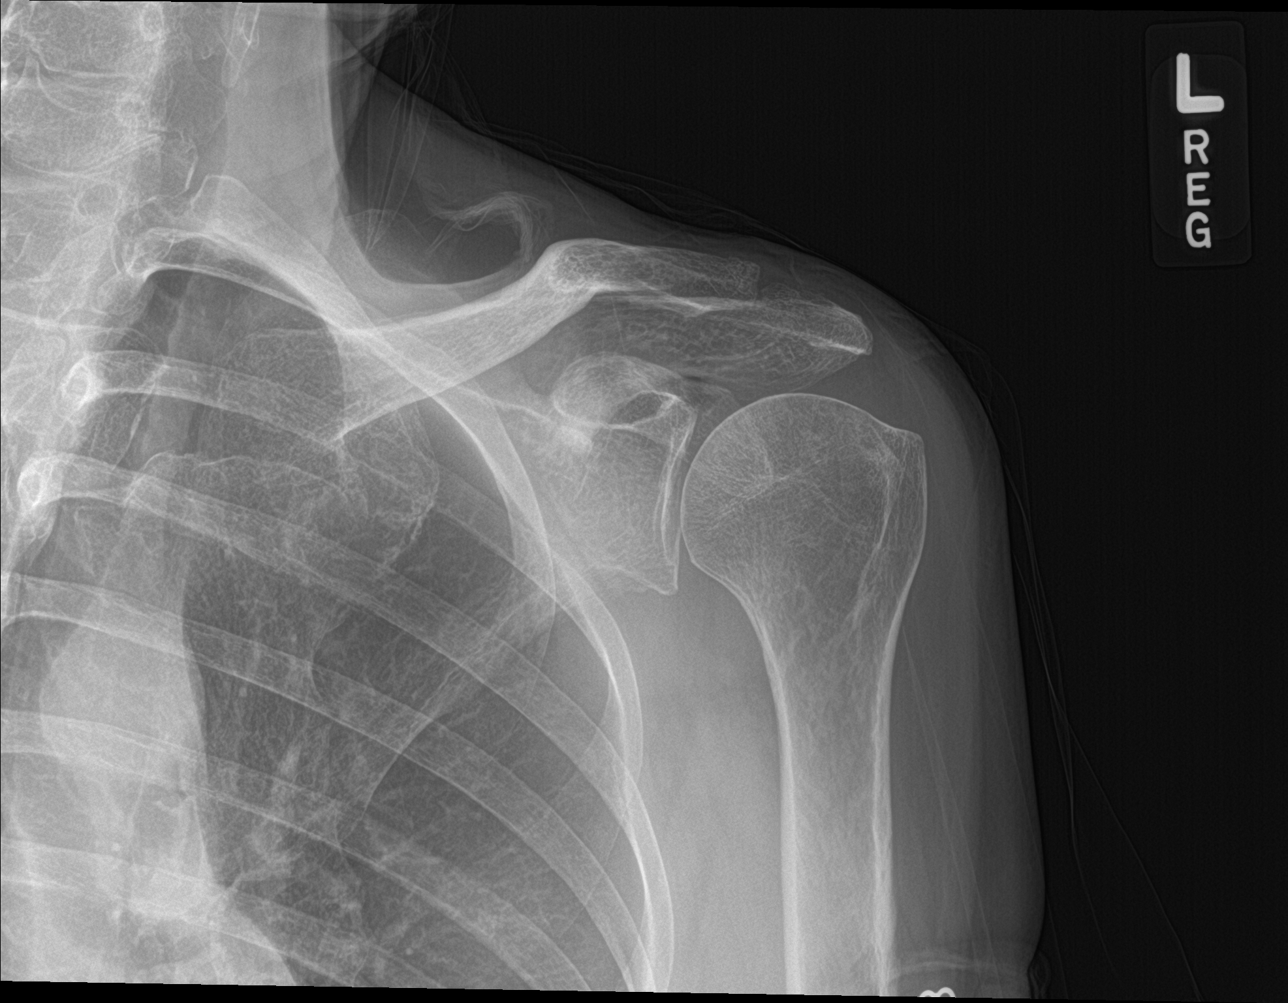

[shoulder y view]
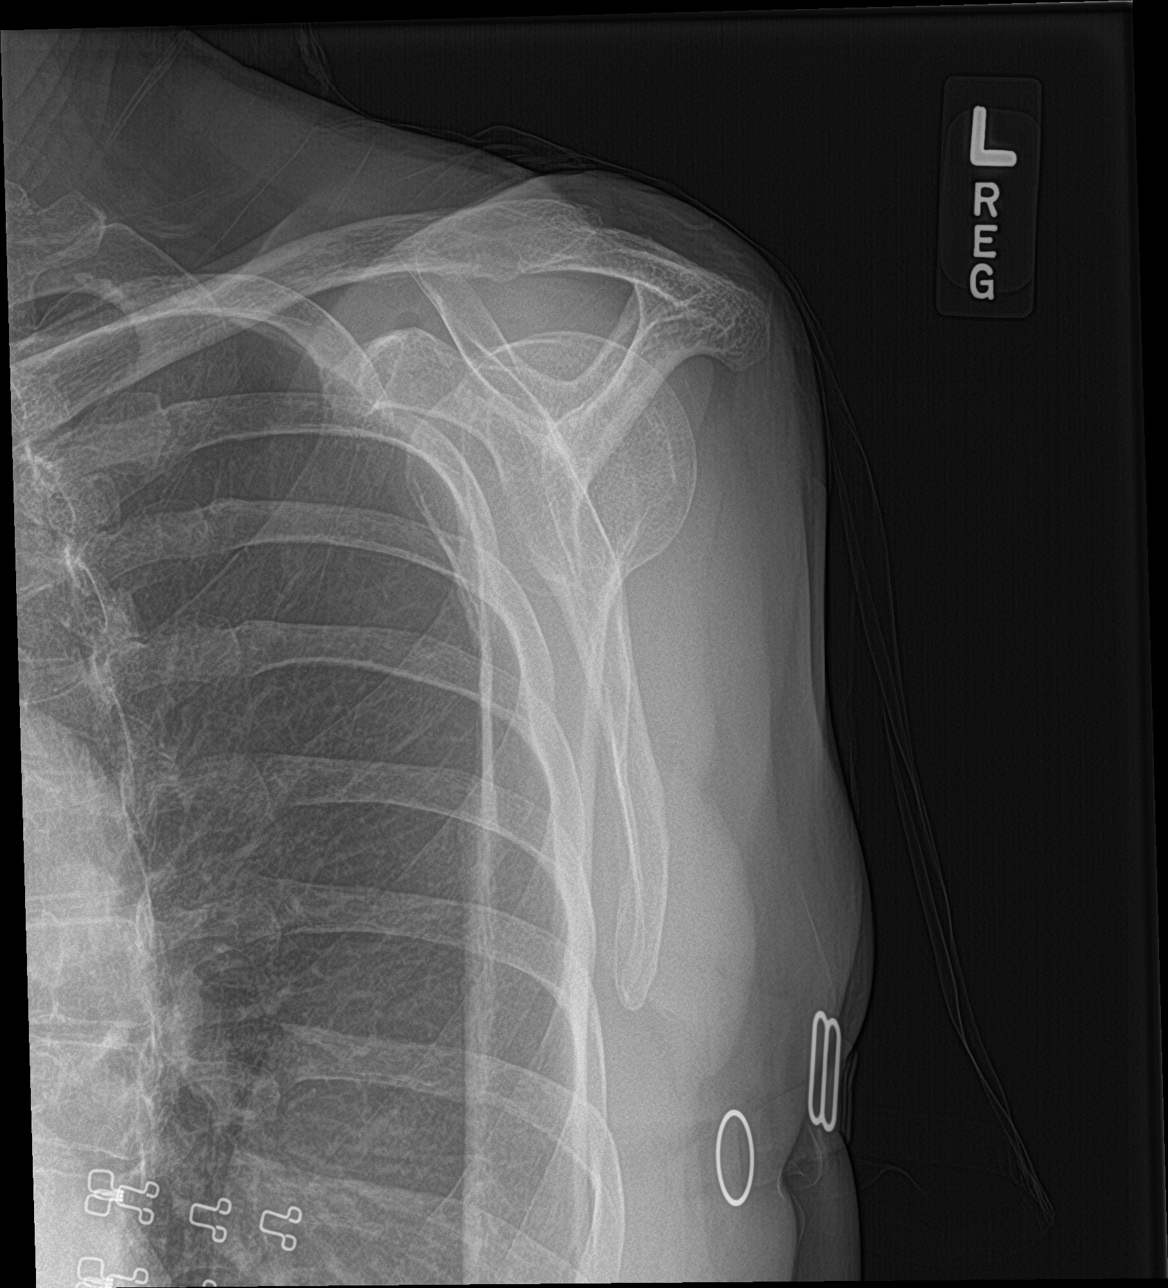

[shoulder axillary]
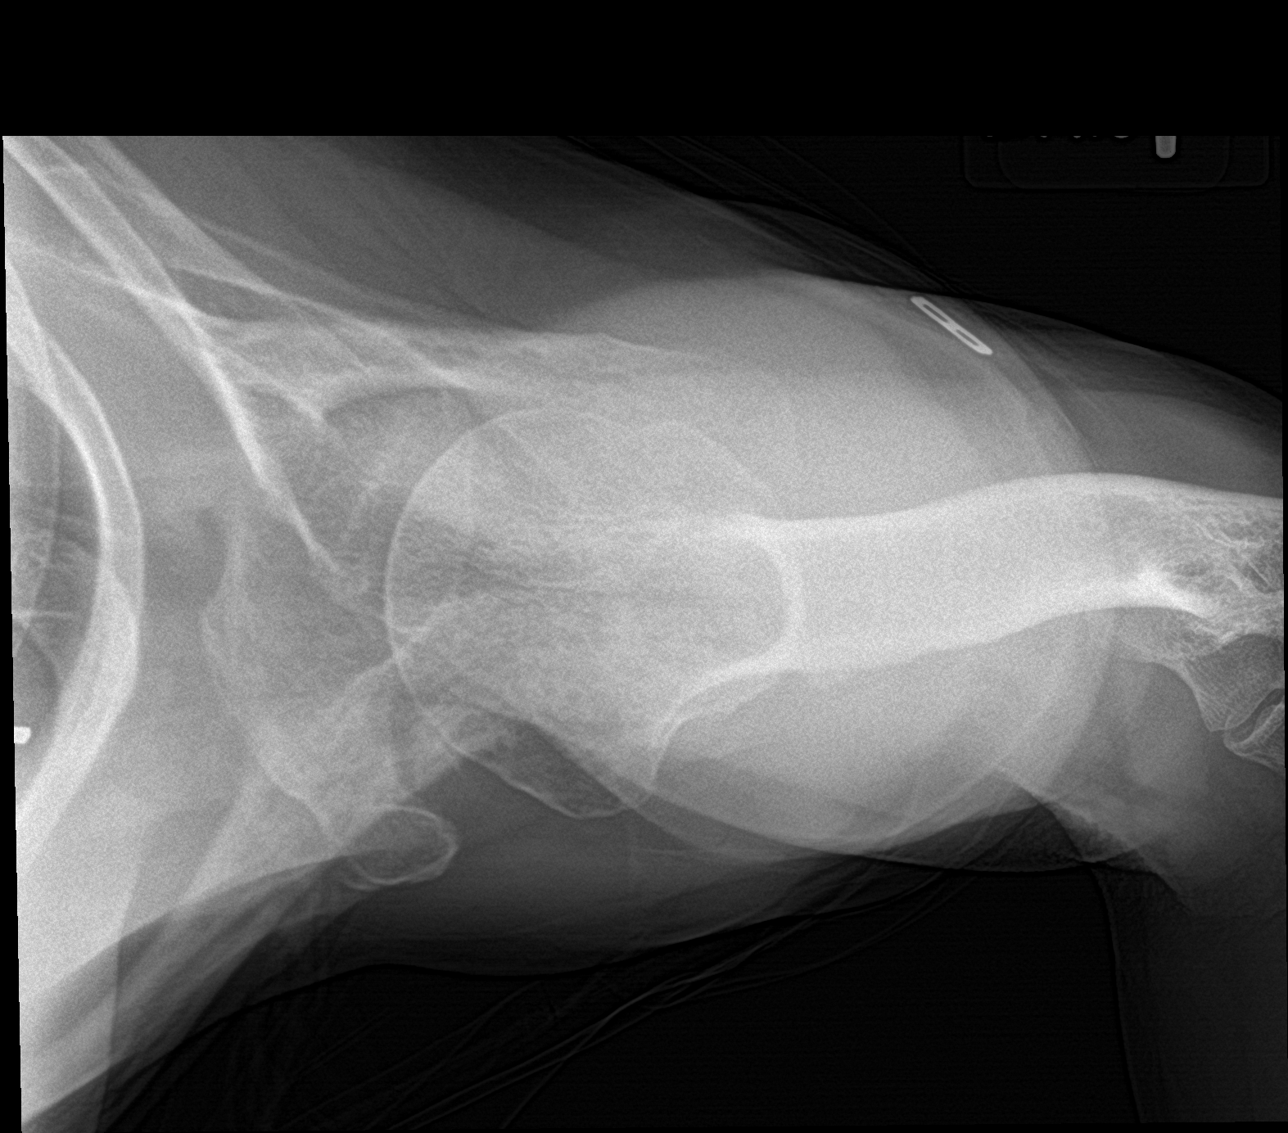

[3 of 3 positions shown; findings below may reference images not displayed]

FINDINGS: There is no evidence of fracture or dislocation. There is no
evidence of arthropathy or other focal bone abnormality. Included
ribs are intact. Soft tissues are unremarkable.
IMPRESSION: Negative radiographs of the left shoulder.

## 2022-01-10 DIAGNOSIS — Z0100 Encounter for examination of eyes and vision without abnormal findings: Secondary | ICD-10-CM | POA: Diagnosis not present

## 2022-03-27 DIAGNOSIS — R350 Frequency of micturition: Secondary | ICD-10-CM | POA: Diagnosis not present

## 2022-03-27 DIAGNOSIS — Z6821 Body mass index (BMI) 21.0-21.9, adult: Secondary | ICD-10-CM | POA: Diagnosis not present

## 2022-11-12 DIAGNOSIS — H524 Presbyopia: Secondary | ICD-10-CM | POA: Diagnosis not present

## 2022-11-12 DIAGNOSIS — H52223 Regular astigmatism, bilateral: Secondary | ICD-10-CM | POA: Diagnosis not present

## 2023-11-17 DIAGNOSIS — H524 Presbyopia: Secondary | ICD-10-CM | POA: Diagnosis not present

## 2023-11-17 DIAGNOSIS — H40013 Open angle with borderline findings, low risk, bilateral: Secondary | ICD-10-CM | POA: Diagnosis not present

## 2023-11-17 DIAGNOSIS — H25812 Combined forms of age-related cataract, left eye: Secondary | ICD-10-CM | POA: Diagnosis not present

## 2023-11-17 DIAGNOSIS — H2511 Age-related nuclear cataract, right eye: Secondary | ICD-10-CM | POA: Diagnosis not present

## 2023-11-17 DIAGNOSIS — H43813 Vitreous degeneration, bilateral: Secondary | ICD-10-CM | POA: Diagnosis not present

## 2023-11-17 DIAGNOSIS — H52223 Regular astigmatism, bilateral: Secondary | ICD-10-CM | POA: Diagnosis not present
